# Patient Record
Sex: Female | Born: 1990 | Race: Black or African American | Hispanic: No | Marital: Single
Health system: Midwestern US, Community
[De-identification: ages and names within clinical notes are randomized; demographics above are authoritative.]

## PROBLEM LIST (undated history)

## (undated) DIAGNOSIS — F329 Major depressive disorder, single episode, unspecified: Secondary | ICD-10-CM

## (undated) DIAGNOSIS — F32A Depression, unspecified: Secondary | ICD-10-CM

## (undated) DIAGNOSIS — R519 Headache, unspecified: Secondary | ICD-10-CM

## (undated) DIAGNOSIS — F99 Mental disorder, not otherwise specified: Secondary | ICD-10-CM

## (undated) DIAGNOSIS — A64 Unspecified sexually transmitted disease: Secondary | ICD-10-CM

## (undated) DIAGNOSIS — O210 Mild hyperemesis gravidarum: Secondary | ICD-10-CM

## (undated) DIAGNOSIS — T148XXA Other injury of unspecified body region, initial encounter: Secondary | ICD-10-CM

## (undated) HISTORY — PX: THERAPEUTIC ABORTION: SHX798

## (undated) HISTORY — DX: Unspecified sexually transmitted disease: A64

---
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
## 2009-07-22 LAB — URINALYSIS W/ RFLX MICROSCOPIC
Bilirubin: NEGATIVE
Blood: NEGATIVE
Glucose: NEGATIVE MG/DL
Ketone: NEGATIVE MG/DL
Nitrites: NEGATIVE
Protein: NEGATIVE MG/DL
Specific gravity: 1.02 (ref 1.003–1.030)
Urobilinogen: 1 EU/DL (ref 0.2–1.0)
pH (UA): 7 (ref 5.0–8.0)

## 2009-07-22 LAB — HCG URINE, QL: HCG urine, QL: NEGATIVE

## 2009-07-22 LAB — WET PREP: Wet prep: NONE SEEN

## 2009-07-22 LAB — URINE MICROSCOPIC ONLY
RBC: 0 /HPF (ref 0–5)
WBC: 11 /HPF (ref 0–4)

## 2009-07-24 LAB — CHLAMYDIA/GC PCR
Chlamydia amplified: NEGATIVE
N. gonorrhea, amplified: NEGATIVE

## 2011-01-18 NOTE — ED Provider Notes (Signed)
KNOWN ALLERGIES   NKDA       TRIAGE Halford Decamp Jan 18, 2011 18:46 BMS1)   PATIENT: NAME: Julie Hayes, Julie Hayes, AGE: 21, GENDER: female, DOB:         Fri 03/27/1990, TIME OF GREET: Tue Jan 18, 2011 17:59, SSN:         [id number], KG WEIGHT: 88.5 (est.), HEIGHT: 160cm, MEDICAL RECORD         NUMBER: [id number], ACCOUNT NUMBER: [id number], PCP: Rozetta Nunnery,.         (Tue Jan 18, 2011 18:46 BMS1)   ADMISSION: URGENCY: 3, TRANSPORT: Ambulatory, DEPT: Emergency,         BED: WAITING. Halford Decamp Jan 18, 2011 18:46 BMS1)   VITAL SIGNS: BP 133/81, (Sitting), Pulse 81, Resp 20, Temp 98.4,         (Oral), Pain 5, O2 Sat 100, on Room air, Time 01/18/2011 18:43. (18:43         BMS1)   COMPLAINT:  Post Partum Bleeding 2 Weeks. Halford Decamp Jan 18, 2011 18:46         BMS1)   PRESENTING COMPLAINT:  Arrives to Er with c/o vaginal bleeding.         states vaginal delivery 12/31/10. f/u with [facility]         for bleeding concerns - seen yesterday. (19:40 LEC1)   PAIN: Patient complains of pain, Pain described as aching, On a         scale 0-10 patient rates pain as 4, Pain is intermittent, Onset was         yesterday. (19:40 LEC1)   TB SCREENING: TB screen negative for this patient. (19:40         LEC1)   ABUSE SCREENING: Patient denies physical abuse or threats. (19:40         LEC1)   FALL RISK: Fall risk assessment not applicable to this patient.         (19:40 LEC1)   SUICIDAL IDEATION: Not Applicable. (19:40 LEC1)   ADVANCE DIRECTIVES: Patient does not have advance directives.         (19:40 LEC1)   PROVIDERS: TRIAGE NURSE: Gypsy Lore, RN, MSN. (Tue Jan 18, 2011 18:46 BMS1)       PRESENTING PROBLEM (18:46 BMS1)      Presenting problems: Vaginal Bleed - Adult.       CURRENT MEDICATIONS (18:46 BMS1)   Patient not taking meds       ORDERS   ED Bedside H/H:  Ordered for: Arvella Merles, MD, Rolm Gala         Status: Done by Darrelyn Hillock, Levora Angel Jan 18, 2011 20:59.         (19:48 BJY7)   Pelvic Exam Setup:  Ordered for: Arvella Merles, MD, Rolm Gala          Status: Done by Dimas Aguas, RN, Raliegh Scarlet Tue Jan 18, 2011 19:56.         (19:50 COL1)   Female standby:  Ordered for: Arvella Merles, MD, Rolm Gala         Status: Done by Cutchins, RN, Raliegh Scarlet Tue Jan 18, 2011 20:09.         (19:50 COL1)   PELVIC/ANOSCOPY EXAM SUPPLIES:  Ordered for: Arvella Merles, MD, Erik         Status: Active. (20:10 LEC1)   h/h?:  Ordered for: Arvella Merles, MD, Rolm Gala         Status: Done  by Darrelyn Hillock, Levora Angel Jan 18, 2011 20:59.         (20:55 ZOX0)   IV- Saline Lock:  Ordered for: Arvella Merles, MD, Rolm Gala         Status: Done by Cutchins, RN, Raliegh Scarlet Tue Jan 18, 2011 21:09.         (21:09 [facility])   IV Start kit:  Ordered for: Arvella Merles, MD, Rolm Gala         Status: Active. (21:35 LEC1)      Ordered for: Arvella Merles, MD, Rolm Gala         Status: Active. (21:35 LEC1)      Ordered for: Arvella Merles, MD, Rolm Gala         Status: Active. (21:35 LEC1)   Elita Boone IV Cath:  Ordered for: Arvella Merles, MD, Rolm Gala         Status: Active. (21:35 LEC1)      Ordered for: Arvella Merles, MD, Rolm Gala         Status: Active. (21:35 LEC1)      Ordered for: Arvella Merles, MD, Rolm Gala         Status: Active. (21:35 LEC1)       NURSING ASSESSMENT: GENITOURINARY (19:40 LEC1)   CONSTITUTIONAL: History obtained from patient, Patient arrives         ambulatory, Gait steady, Patient appears comfortable, Patient         cooperative, Patient alert, Oriented to person, place and time, Skin         warm, Skin dry, Skin normal in color, Mucous membranes pink, Mucous         membranes moist, Patient is well-groomed, Patient complains of c/o         bright red bleeding s/p vaginal birth on 12/14/ 12, States being seen         at [facility] for same complaint yesterday. Patient states using 6 pads per         day for bleeding concerns.   PAIN FEMALE: cramping pain, to the lower abdomen, Onset of pain         yesterday, on a scale 0-10 patient rates pain as 4.   GENITOURINARY FEMALE: no associated urinary complaints, no         associated vaginal discharge, Associated with vaginal bleeding ,          moderate amount, of bright red blood, six pads saturated, Onset of         bleeding: yesterdayh.   ABDOMEN: Abdomen soft, tender, lower abdomen, Bowel sound normal,         Patient's last bowel was 01/18/11, no associated nausea, no associated         vomiting, no associated diarrhea, no associated constipation.   NOTES: Notes: Patient states that she is not bleeding as much as         yesterday.   SAFETY: Side rails up, Cart/Stretcher in lowest position, Family         at bedside, Call light within reach, Hospital ID band on.       NURSING PROCEDURE: DISCHARGE NOTE (21:48 LEC1)   DISCHARGE: Patient discharged to home, ambulating without         assistance, driving self, accompanied by husband/wife/partner,         Discharge instructions given to patient, Simple or moderate discharge         teaching performed, by Herbie Drape, RN, f/u as directed., Above         person(s) verbalized understanding of  discharge instructions and         follow-up care, Notes: patient in stable condition at time of         discharge to home.   BELONGINGS: Belongings remain with patient, Valuables remain with         patient.       NURSING PROCEDURE: IV   PATIENT IDENITIFIER: Patient's identity verified by patient         stating name, Patient's identity verified by patient stating birth         date, Patient's identity verified by hospital ID bracelet, Patient         actively involved in identification process. (21:06 JEC3)     Patient's identity verified by patient stating name, Patient's identity         verified by patient stating birth date. (20:55 LEC1)   IV SITE 1: IV established, to the left antecubital, in two         attempts, Unable to obtain IV access, Tourniquet removed from patient         after procedure. (21:06 JEC3)     IV therapy indicated for hydration, IV therapy indicated for medication         administration, IV established, to the right hand, using a 20 gauge          catheter, in one attempt, Saline lock established, Flushed with         normal saline (mls): 10, Labs drawn at time of placement, labeled in         the presence of the patient and sent to lab, Labs drawn at [employer]         drawn for bedside h/h, Tourniquet removed from patient after         procedure. (20:55 LEC1)   FOLLOW-UP SITE 1: After procedure, sterile transparent dressing         applied, After procedure, no drainage at IV site, After procedure, no         swelling at IV site, After procedure, no redness at IV site. (20:55         LEC1)     IV discontinued, as ordered, due to patient being discharged, catheter         intact. (21:48 LEC1)   SAFETY: Side rails up, Cart/Stretcher in lowest position, Call         light within reach, Hospital ID band on. (21:06 JEC3)       NURSING PROCEDURE: NURSE NOTES (21:34 LEC1)   NURSES NOTES: Patient in no apparent distress, Patient states         decreased pain, Patient resting quietly.       NURSING PROCEDURE: PELVIC EXAM (20:09 LEC1)   PATIENT IDENTIFIER: Patient's identity verified by patient         stating name, Patient's identity verified by patient stating birth         date.   PELVIC EXAM: Pelvic exam indicated for vaginal bleeding, Pelvic         exam performed by Dr. Jill Side, PA-C, Pelvic exam assisted by Herbie Drape, RN.   FOLLOW-UP: After procedure, bleeding continues, small amount,         Dark brown blood noted.   SAFETY: Side rails up, Cart/Stretcher in lowest position, Family         at bedside, Call light within reach, Hospital ID band on.  DIAGNOSIS (21:41 EHK1)   FINAL: PRIMARY: POST PARTUM BLEEDING.       DISPOSITION   PATIENT:  Disposition Type: Discharged, Disposition: Discharged,         Disposition Transport: Family/Friend drive, Condition: Stable. (21:41         [facility])      Patient left the department. (21:50 LEC1)       VITAL SIGNS   VITAL SIGNS: BP: 133/81 (Sitting), Pulse: 81, Resp: 20, Temp:          98.4 (Oral), Pain: 5, O2 sat: 100 on Room air, Time: 01/18/2011 18:43.         (18:43 BMS1)     BP: 125/74 (Sitting), Pulse: 77, Resp: 16, Pain: 4, O2 sat: 99 on Room         air, Time: 01/18/2011 21:34. (21:34 LEC1)       INSTRUCTION (21:42 EHK1)   SPECIAL:  Return if vomit, fever, more pain, short of breath,         HEAVY BLEEDING         Follow up with primary care physician AND ULTRASOUND @ [facility] AS         SCHEDULED ALREADY.         Return to the ER if condition worsens or new symptoms develop.         Take 600 mg (3 tabs) ibuprofen OTC 3 times daily for inflammation         Encourage fluids.       PRESCRIPTION     No recorded prescriptions       ADMIN (21:50 LEC1)   DIGITAL SIGNATURE:  Cutchins, RN, Raliegh Scarlet.   Key:     BMS1=Shortt, RN, MSN, Britta Mccreedy  COL1=OLeary, PA-C, Retta Mac, MD,     Alto Denver, ACT III, Fredrik Rigger  LEC1=Cutchins, RN, Raliegh Scarlet

## 2017-01-07 ENCOUNTER — Emergency Department

## 2017-01-07 ENCOUNTER — Inpatient Hospital Stay
Admit: 2017-01-07 | Discharge: 2017-01-07 | Disposition: A | Discharge status: Home / Self Care | Payer: PRIVATE HEALTH INSURANCE | Attending: Emergency Medicine

## 2017-01-07 DIAGNOSIS — S8011XA Contusion of right lower leg, initial encounter: Secondary | ICD-10-CM

## 2017-01-07 LAB — CBC WITH AUTOMATED DIFF
BASOPHILS: 0.5 % (ref 0–3)
EOSINOPHILS: 1 % (ref 0–5)
HCT: 41 % (ref 37.0–50.0)
HGB: 13.3 gm/dl (ref 13.0–17.2)
IMMATURE GRANULOCYTES: 0.3 % (ref 0.0–3.0)
LYMPHOCYTES: 36.4 % (ref 28–48)
MCH: 27.5 pg (ref 25.4–34.6)
MCHC: 32.4 gm/dl (ref 30.0–36.0)
MCV: 84.9 fL (ref 80.0–98.0)
MONOCYTES: 5.9 % (ref 1–13)
MPV: 10.2 fL — ABNORMAL HIGH (ref 6.0–10.0)
NEUTROPHILS: 55.9 % (ref 34–64)
NRBC: 0 (ref 0–0)
PLATELET: 201 10*3/uL (ref 140–450)
RBC: 4.83 M/uL (ref 3.60–5.20)
RDW-SD: 42.7 (ref 36.4–46.3)
WBC: 6.3 10*3/uL (ref 4.0–11.0)

## 2017-01-07 LAB — D DIMER: D DIMER: 0.57 ug/mL (FEU) — ABNORMAL HIGH (ref 0.01–0.50)

## 2017-01-07 LAB — D-DIMER, QUANTITATIVE: D-Dimer, Quant: 0.57 ug/mL (FEU) — ABNORMAL HIGH (ref 0.01–0.50)

## 2017-01-07 NOTE — ED Notes (Signed)
PVL tech paged, awaiting return phone call to confirm they are aware of the order.

## 2017-01-07 NOTE — ED Notes (Signed)
PVL tech at bedside

## 2017-01-07 NOTE — ED Triage Notes (Signed)
Pt states lower extremity bruising states symptoms have been ongoing for the

## 2017-01-07 NOTE — ED Notes (Signed)
PVL is finished

## 2017-01-07 NOTE — ED Provider Notes (Addendum)
[facility]  [facility] Treatment [facility]    Patient: Julie Hayes Age: 26 y.o. Sex: female    Date of Birth: Oct 14, 1990 Admit Date: 01/07/2017 PCP: None   MRN: [id number]  CSN: [id number]  Attending: Christiana PellantFICKENSCHER, BEN A, MD   Room: 102/EO02 Time Dictated: 1:08 AM APP: Clayborne Danalushola Juliette Standre       Chief Complaint   Chief Complaint   Patient presents with   ??? Bleeding/Bruising     just brusises on leg       History of Present Illness   26 y.o. female otherwise healthy who presents to the ED today complaining about bruising that started about a month ago.  Patient states she noted of big bruise in her right thigh a month ago then a week later noted another bruise on her left leg.  She also reports some pain in her bilateral legs that radiates to her by hip area.  States this typically happens when her menstrual cycle is about to come on so she is unsure if this is related.  She denies any trauma or falls.  She had a recent long distance travels of 6 hours and she wants to be evaluated to see if she has any blood clots.  Of note, patient takes Motrin 600 mg roughly 4 times a week for history of migraines.  She also reports heavy flow for 5 days with menstrual.  However states it improved from previous 7 days.  Denies any hormonal use, no recent surgeries or history of blood clots.    Review of Systems   Review of Systems   Constitutional: Negative for chills and fever.   Respiratory: Negative for shortness of breath.    Cardiovascular: Negative for chest pain.   Musculoskeletal:        Shooting pain from bilateral legs to hip area   Neurological: Negative for dizziness and loss of consciousness.   Endo/Heme/Allergies: Bruises/bleeds easily.       Past Medical/Surgical History   Past Medical history: None  Past surgical history: None    Social History     Social History     Socioeconomic History   ??? Marital status: SINGLE     Spouse name: Not on file   ??? Number of children: Not on file    ??? Years of education: Not on file   ??? Highest education level: Not on file       Family History   History reviewed. No pertinent family history.    Current Medications     None       Allergies   No Known Allergies    Physical Exam     ED Triage Vitals   Enc Vitals Group      BP 01/07/17 0039 136/66      Pulse (Heart Rate) 01/07/17 0039 88      Resp Rate 01/07/17 0039 18      Temp 01/07/17 0039 98.3 ??F (36.8 ??C)      Temp src --       O2 Sat (%) 01/07/17 0039 100 %      Weight 01/07/17 0040 227 lb      Height 01/07/17 0040 5\' 4"       Head Circumference --       Peak Flow --       Pain Score --       Pain Loc --       Pain Edu? --  Excl. in GC? --      Physical Exam   Constitutional: She is well-developed, well-nourished, and in no distress. No distress.   HENT:   Head: Normocephalic and atraumatic.   Eyes: Conjunctivae are normal.   Pulmonary/Chest: Effort normal and breath sounds normal. No respiratory distress. She has no wheezes.   Musculoskeletal: Normal range of motion. She exhibits no edema, tenderness or deformity.   Negative bilateral calf tenderness.  No edema bilaterally   Skin: Skin is warm and dry. No rash noted. She is not diaphoretic. No erythema.   Nursing note and vitals reviewed.      Impression and Management Plan   Patient is a 26 year old female who presents the ED with several episodes of easy bruising in her bilateral lower extremity.  Also reports some lower extremity pain radiating to her right hip area as well as a recent long distance travel 4 days ago started the same day.  We will get a CBC, d-dimer to evaluate for thrombocytopenia and blood clots.    Diagnostic Studies     Results for orders placed or performed during the hospital encounter of 01/07/17   CBC WITH AUTOMATED DIFF   Result Value Ref Range    WBC 6.3 4.0 - 11.0 1000/mm3    RBC 4.83 3.60 - 5.20 M/uL    HGB 13.3 13.0 - 17.2 gm/dl    HCT 16.141.0 09.637.0 - 04.550.0 %    MCV 84.9 80.0 - 98.0 fL    MCH 27.5 25.4 - 34.6 pg     MCHC 32.4 30.0 - 36.0 gm/dl    PLATELET 409201 811140 - 914450 1000/mm3    MPV 10.2 (H) 6.0 - 10.0 fL    RDW-SD 42.7 36.4 - 46.3      NRBC 0 0 - 0      IMMATURE GRANULOCYTES 0.3 0.0 - 3.0 %    NEUTROPHILS 55.9 34 - 64 %    LYMPHOCYTES 36.4 28 - 48 %    MONOCYTES 5.9 1 - 13 %    EOSINOPHILS 1.0 0 - 5 %    BASOPHILS 0.5 0 - 3 %   D DIMER   Result Value Ref Range    D DIMER 0.57 (H) 0.01 - 0.50 ug/mL (FEU)         ED Course          ER Medications Given:  Medications - No data to display    Medical Decision Making   CBC is unremarkable.  Platelets and hemoglobin are within normal limits.  D-dimer is elevated at 0.57.  Will get a duplex bilateral lower extremity PVL in the ED overflow in the morning.  If negative patient can be discharged home.        Final Diagnosis        ICD-10-CM ICD-9-CM   1. Bruising T14.8XXA 924.9   2. Bilateral leg pain M79.604 729.5    M79.605    3. Elevated d-dimer R79.89 790.92       Disposition   ED overflow for PVL in the morning.    The patient was personally evaluated by myself and Carmela HurtFICKENSCHER, BEN A, MD who agrees with the above assessment and plan.        Dragon medical dictation software was used for portions of this report. Unintended errors may occur.       Lynelle Doctorlushola Margorie Renner, PA-C  January 07, 2017      My signature above authenticates this document and my orders, the  final ??  diagnosis (es), discharge prescription (s), and instructions in the Epic ??  record.  If you have any questions please contact [phone].  ??  Nursing notes have been reviewed by the physician/ advanced practice Clinician.

## 2017-01-07 NOTE — ED Notes (Signed)
I was asked to follow-up on the patient's PVL results.  They were negative for DVT.  Discussed with patient and given primary care resources for follow-up.    PVL Bilateral Lower Extremity Venous Preliminary Results  ??  Bilateral:  No evidence of thrombus identified in the deep veins and the proximal greater saphenous vein.  ??  Final MD report to follow.  Exam Performed by: Tod Persiaara Dzendzel, RVT,RVS        Julien GirtErin E Shalisa Mcquade, PA-C  10:01 AM  01/07/17

## 2017-01-07 NOTE — ED Notes (Signed)
10:10 AM  01/07/17     Discharge instructions given to patient (name) with verbalization of understanding. Patient accompanied by friend.  Patient discharged with the following prescriptions none. Patient discharged to home (destination).      Darleen Moffitt Jackelyn KnifeUyen T. Britt Petroni, RN

## 2017-01-07 NOTE — Progress Notes (Signed)
PVL Bilateral Lower Extremity Venous Preliminary Results    Bilateral:  No evidence of thrombus identified in the deep veins and the proximal greater saphenous vein.    Final MD report to follow.  Exam Performed by: Cara Dzendzel, RVT,RVS

## 2017-01-07 NOTE — ED Notes (Signed)
Report given to Toniann FailWendy, RN and Mimi, Charity fundraiserN (name) on [name].     Report consisted of patient???s Situation, Background, Assessment and   Recommendations(SBAR).     Opportunity for questions and clarification was provided.

## 2017-01-07 NOTE — ED Notes (Signed)
Patient denies any pain at this time. Was advised to take off pants to be ready for PVL.

## 2017-01-07 NOTE — ED Notes (Signed)
TRANSFER - IN REPORT:    Verbal report received from Ore Hillharlene, RN on [name].      Report consisted of patient???s Situation, Background, Assessment and   Recommendations(SBAR).     Information from the following report(s) SBAR, Kardex, ED Summary, [facility] and Recent Results was reviewed with the receiving nurse.    Opportunity for questions and clarification was provided.      Assessment completed upon patient???s care assumed.

## 2018-01-24 ENCOUNTER — Ambulatory Visit (INDEPENDENT_AMBULATORY_CARE_PROVIDER_SITE_OTHER): Payer: 59 | Admitting: Gynecology

## 2018-01-24 ENCOUNTER — Encounter: Payer: Self-pay | Admitting: Gynecology

## 2018-01-24 VITALS — BP 118/78 | Ht 64.0 in | Wt 223.0 lb

## 2018-01-24 DIAGNOSIS — Z01419 Encounter for gynecological examination (general) (routine) without abnormal findings: Secondary | ICD-10-CM | POA: Diagnosis not present

## 2018-01-24 DIAGNOSIS — Z113 Encounter for screening for infections with a predominantly sexual mode of transmission: Secondary | ICD-10-CM | POA: Diagnosis not present

## 2018-01-24 DIAGNOSIS — N951 Menopausal and female climacteric states: Secondary | ICD-10-CM | POA: Diagnosis not present

## 2018-01-24 DIAGNOSIS — L680 Hirsutism: Secondary | ICD-10-CM

## 2018-01-24 DIAGNOSIS — N898 Other specified noninflammatory disorders of vagina: Secondary | ICD-10-CM

## 2018-01-24 LAB — WET PREP FOR TRICH, YEAST, CLUE

## 2018-01-24 MED ORDER — METRONIDAZOLE 500 MG PO TABS: 500.0000 mg | ORAL_TABLET | Freq: Two times a day (BID) | ORAL | 0 refills | Status: DC

## 2018-01-24 NOTE — Progress Notes (Signed)
Manna Howze 08-15-1990 [id number]        28 y.o.  [id number] new patient for annual gynecologic exam.  Notes vaginal itching several days before her menses each month.  No significant discharge or odor.  Also complaining of hot flashes almost on a daily basis starting 7 years ago after the birth of her child.  Having regular monthly menses.  Recently screened by her primary with normal lab work to include thyroid.  Lastly the patient notes hair growth on her chin which started after the birth of her child 7 years ago but seems to be progressively getting worse.  Also has hair growth around her nipples.  Past medical history,surgical history, problem list, medications, allergies, family history and social history were all reviewed and documented as reviewed in the EPIC chart.  ROS:  Performed with pertinent positives and negatives included in the history, assessment and plan.   Additional significant findings : None   Exam: Kennon Portela assistant Vitals:   01/24/18 0824  BP: 118/78  Weight: 223 lb (101.2 kg)  Height: 5\' 4"  (1.626 m)   Body mass index is 38.28 kg/m.  General appearance:  Normal affect, orientation and appearance. Skin: Grossly normal excepting terminal hair growth on her chin HEENT: Without gross lesions.  No cervical or supraclavicular adenopathy. Thyroid normal.  Lungs:  Clear without wheezing, rales or rhonchi Cardiac: RR, without RMG Abdominal:  Soft, nontender, without masses, guarding, rebound, organomegaly or hernia Breasts:  Examined lying and sitting without masses, retractions, discharge or axillary adenopathy. Pelvic:  Ext, BUS, Vagina: Normal with whitish discharge  Cervix: Normal.  Pap smear done  Uterus: Anteverted, normal size, shape and contour, midline and mobile nontender   Adnexa: Without masses or tenderness    Anus and perineum: Normal    Assessment/Plan:  28 y.o. [id number] female for annual gynecologic exam with regular menses.   1. Hot  flushes.  Recent screen for thyroid negative.  Discussed differential to include hormonal fluctuations although not true menopause or perimenopause versus other medical reasons.  Recently saw her primary with that complaint by her history with negative blood work.  Will check baseline FSH and estradiol level.  Discussed possible trial of estrogen to see if this does not help resolve her symptoms.  Recommended she consider low-dose oral contraceptives which will help address #2.  Risks of oral contraceptives to include increased risk of thrombosis discussed.  Will rediscuss after lab work. 2. 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  Acne, lower abdominal hair growth, masculinizing signs such as balding or clitorimegaly.  Suspect physiologic/ovarian androgen as source.  Will check baseline testosterone with SH[facility].  Do not feel 17 hydroxyprogesterone or DHEAS necessary at this point.  Discussed low-dose oral contraceptives as a treatment option which they addressed #1.  Will rediscuss after lab work. 3. Contraception.  Not an issue as patient is in a same-sex relationship. 4. STD screening requested.  No known exposure but wants to be screened.  GC/Chlamydia added to her Pap smear.  HIV, RPR, hepatitis B and hepatitis C drawn. 5. Vaginal itching preceding her menses.  Wet prep and exam consistent with bacterial vaginosis.  Will treat with Flagyl 500 mg twice daily x7 days.  Alcohol avoidance discussed.  Follow-up if symptoms persist. 6. Pap smear done today.  No history of abnormal Pap smears previously.  Continue with every 3-year screening for now. 7. Breast health.  Breast exam normal today.  No strong family history of breast cancer.  8. Health maintenance.  Patient is actively followed by a primary provider.  No blood work done today.  We will follow-up with them for ongoing general health care.   Dara Lords MD, 8:42 AM 01/24/2018

## 2018-01-24 NOTE — Addendum Note (Signed)
Addended by: Dayna Barker on: 01/24/2018 02:47 PM   Modules accepted: Orders

## 2018-01-24 NOTE — Patient Instructions (Signed)
Office will call you with the lab results.  Start on the metronidazole 500 mg twice daily for 7 days to treat the vaginal infection.  Avoid alcohol while taking.

## 2018-01-24 NOTE — Addendum Note (Signed)
Addended by: Dayna BarkerGARDNER, Shevawn Langenberg K on: 01/24/2018 09:44 AM   Modules accepted: Orders

## 2018-01-26 LAB — PAP THINPREP ASCUS RFLX HPV RFLX TYPE
C. trachomatis RNA, TMA: NOT DETECTED
N. GONORRHOEAE RNA, TMA: NOT DETECTED

## 2018-01-29 ENCOUNTER — Other Ambulatory Visit: Payer: Self-pay | Admitting: Gynecology

## 2018-01-29 LAB — HEPATITIS C ANTIBODY
Hepatitis C Ab: NONREACTIVE
SIGNAL TO CUT-OFF: 0.02 (ref <1.00)

## 2018-01-29 LAB — HEPATITIS B SURFACE ANTIGEN: Hepatitis B Surface Ag: NONREACTIVE

## 2018-01-29 LAB — TESTOS,TOTAL,FREE AND SHBG (FEMALE)
Free Testosterone: 3.2 pg/mL (ref 0.1–6.4)
Sex Hormone Binding: 57 nmol/L (ref 17–124)
Testosterone, Total, LC-MS-MS: 34 ng/dL (ref 2–45)

## 2018-01-29 LAB — FOLLICLE STIMULATING HORMONE: FSH: 6.1 m[IU]/mL

## 2018-01-29 LAB — ESTRADIOL: Estradiol: 40 pg/mL

## 2018-01-29 LAB — RPR: RPR Ser Ql: NONREACTIVE

## 2018-01-29 LAB — HIV ANTIBODY (ROUTINE TESTING W REFLEX): HIV 1&2 Ab, 4th Generation: NONREACTIVE

## 2018-01-29 MED ORDER — NORETHIN ACE-ETH ESTRAD-FE 1-20 MG-MCG PO TABS: 1.0000 | ORAL_TABLET | Freq: Every day | ORAL | 11 refills | Status: DC

## 2018-03-21 ENCOUNTER — Other Ambulatory Visit: Payer: Self-pay

## 2018-03-21 ENCOUNTER — Emergency Department (HOSPITAL_COMMUNITY): Admission: EM | Admit: 2018-03-21 | Discharge: 2018-03-21 | Discharge status: Against Medical Advice | Payer: 59

## 2018-03-21 NOTE — ED Triage Notes (Signed)
Called for pt x 1 from waiting room with no response.

## 2018-03-21 NOTE — ED Notes (Signed)
Pt. Called to triage x 2 but no answer.

## 2018-07-18 ENCOUNTER — Ambulatory Visit (INDEPENDENT_AMBULATORY_CARE_PROVIDER_SITE_OTHER): Payer: 59

## 2018-07-18 ENCOUNTER — Other Ambulatory Visit: Payer: Self-pay

## 2018-07-18 DIAGNOSIS — Z3200 Encounter for pregnancy test, result unknown: Secondary | ICD-10-CM

## 2018-07-18 DIAGNOSIS — Z3201 Encounter for pregnancy test, result positive: Secondary | ICD-10-CM | POA: Diagnosis not present

## 2018-07-18 LAB — POCT URINE PREGNANCY: Preg Test, Ur: POSITIVE — AB

## 2018-07-18 NOTE — Progress Notes (Signed)
..   Ms. Ganson presents today for UPT. She has no unusual complaints. LMP: 05-31-18    OBJECTIVE: Appears well, in no apparent distress.  OB History    Gravida  4   Para  1   Term  1   Preterm  0   AB  2   Living  1     SAB  0   TAB  2   Ectopic  0   Multiple  0   Live Births  1          Home UPT Result:Positive  In-Office UPT result:Positive I have reviewed the patient's medical, obstetrical, social, and family histories, and medications.   ASSESSMENT: Positive pregnancy test  PLAN Prenatal care to be completed at: [facility]

## 2018-08-02 ENCOUNTER — Other Ambulatory Visit: Payer: Self-pay

## 2018-08-02 ENCOUNTER — Emergency Department (HOSPITAL_COMMUNITY): Payer: 59

## 2018-08-02 ENCOUNTER — Emergency Department (HOSPITAL_COMMUNITY)
Admission: EM | Admit: 2018-08-02 | Discharge: 2018-08-02 | Disposition: A | Discharge status: Home / Self Care | Payer: 59 | Attending: Emergency Medicine | Admitting: Emergency Medicine

## 2018-08-02 ENCOUNTER — Encounter (HOSPITAL_COMMUNITY): Payer: Self-pay

## 2018-08-02 DIAGNOSIS — O418X10 Other specified disorders of amniotic fluid and membranes, first trimester, not applicable or unspecified: Secondary | ICD-10-CM | POA: Insufficient documentation

## 2018-08-02 DIAGNOSIS — Z3A01 Less than 8 weeks gestation of pregnancy: Secondary | ICD-10-CM | POA: Diagnosis not present

## 2018-08-02 DIAGNOSIS — O468X1 Other antepartum hemorrhage, first trimester: Secondary | ICD-10-CM | POA: Insufficient documentation

## 2018-08-02 DIAGNOSIS — O200 Threatened abortion: Secondary | ICD-10-CM | POA: Diagnosis not present

## 2018-08-02 DIAGNOSIS — Z87891 Personal history of nicotine dependence: Secondary | ICD-10-CM | POA: Insufficient documentation

## 2018-08-02 LAB — BASIC METABOLIC PANEL
Anion gap: 7 (ref 5–15)
BUN: 10 mg/dL (ref 6–20)
CO2: 25 mmol/L (ref 22–32)
Calcium: 9.5 mg/dL (ref 8.9–10.3)
Chloride: 105 mmol/L (ref 98–111)
Creatinine, Ser: 0.61 mg/dL (ref 0.44–1.00)
GFR calc Af Amer: >60 mL/min (ref >60)
GFR calc non Af Amer: >60 mL/min (ref >60)
Glucose, Bld: 92 mg/dL (ref 70–99)
Potassium: 4.3 mmol/L (ref 3.5–5.1)
Sodium: 137 mmol/L (ref 135–145)

## 2018-08-02 LAB — URINALYSIS, ROUTINE W REFLEX MICROSCOPIC
Bilirubin Urine: NEGATIVE
Glucose, UA: NEGATIVE mg/dL
Hgb urine dipstick: NEGATIVE
Ketones, ur: NEGATIVE mg/dL
Leukocytes,Ua: NEGATIVE
Nitrite: NEGATIVE
Protein, ur: NEGATIVE mg/dL
Specific Gravity, Urine: 1.021 (ref 1.005–1.030)
pH: 6 (ref 5.0–8.0)

## 2018-08-02 LAB — CBC
HCT: 40.9 % (ref 36.0–46.0)
Hemoglobin: 13.1 g/dL (ref 12.0–15.0)
MCH: 28.1 pg (ref 26.0–34.0)
MCHC: 32 g/dL (ref 30.0–36.0)
MCV: 87.8 fL (ref 80.0–100.0)
Platelets: 241 10*3/uL (ref 150–400)
RBC: 4.66 MIL/uL (ref 3.87–5.11)
RDW: 13.8 % (ref 11.5–15.5)
WBC: 7.7 10*3/uL (ref 4.0–10.5)
nRBC: 0 % (ref 0.0–0.2)

## 2018-08-02 LAB — WET PREP, GENITAL
Sperm: NONE SEEN
Trich, Wet Prep: NONE SEEN
Yeast Wet Prep HPF POC: NONE SEEN

## 2018-08-02 LAB — TROPONIN I (HIGH SENSITIVITY)
Troponin I (High Sensitivity): <2 ng/L (ref <18)
Troponin I (High Sensitivity): <2 ng/L (ref <18)

## 2018-08-02 LAB — HCG, QUANTITATIVE, PREGNANCY: hCG, Beta Chain, Quant, S: 18195 m[IU]/mL — ABNORMAL HIGH (ref <5)

## 2018-08-02 LAB — ABO/RH: ABO/RH(D): B POS

## 2018-08-02 MED ORDER — SODIUM CHLORIDE 0.9% FLUSH
3.0000 mL | Freq: Once | INTRAVENOUS | Status: AC
  Administered 2018-08-02: 3 mL via INTRAVENOUS

## 2018-08-02 NOTE — Discharge Instructions (Signed)
Do not lift anything that is heavier than 10 lbs. (4.5 kg) or as told by your health care provider. Do not use any products that contain nicotine or tobacco, such as cigarettes and e-cigarettes. If you need help quitting, ask your health care provider. Track and write down the number of pads you use each day and how soaked (saturated) they are. Do not use tampons. Keep all follow-up visits as told by your health care provider. This is important. Your health care provider may ask you to have follow-up blood tests or ultrasound tests or both.  Contact a health care provider if: You have any vaginal bleeding. You have a fever. Get help right away if: You have severe cramps in your stomach, back, abdomen, or pelvis. You pass large clots or tissue. Save any tissue for your health care provider to look at. You have more vaginal bleeding, and you faint or become lightheaded or weak.

## 2018-08-02 NOTE — ED Triage Notes (Signed)
Patient c/o abdominal cramping x 1-2 weeks. Patient states she has been having spotting x 1 week. Patient states when she wipes she has pink on the tissue paper and noticed it the first time after having intercourse.  Patient c/o intermittent mid chest pain  X 4 months.

## 2018-08-02 NOTE — ED Provider Notes (Signed)
[facility]-EMERGENCY DEPT Provider Note   CSN: [id number] Arrival date & time: 08/02/18  1740     History   Chief Complaint Chief Complaint  Patient presents with  . Abdominal Cramping  . Vaginal Bleeding  . [redacted] weeks pregnant  . Chest Pain    HPI Megan Chambers is a 28 y.o. female.  Who presents emergency department with chief complaint of vaginal bleeding.  She is G5, P1.  She believes she has a currently about 9 or [redacted] weeks pregnant her last menstrual period was May 31, 2018.  Patient states that she developed some vaginal bleeding after intercourse last week.  It has been persistent.  She has had some cramping in her abdomen.  She denies any fluid or passage of tissue from her vagina.  She denies fevers, chills, back pain, urinary symptoms.     HPI  Past Medical History:  Diagnosis Date  . STD (sexually transmitted disease)    Chlamydia,Trich    There are no active problems to display for this patient.   Past Surgical History:  Procedure Laterality Date  . THERAPEUTIC ABORTION       OB History    Gravida  4   Para  1   Term  1   Preterm  0   AB  2   Living  1     SAB  0   TAB  2   Ectopic  0   Multiple  0   Live Births  1            Home Medications    Prior to Admission medications   Medication Sig Start Date End Date Taking? Authorizing Provider  Prenatal Vit-Fe Fumarate-FA (PRENATAL MULTIVITAMIN) TABS tablet Take 1 tablet by mouth daily at 12 noon.   Yes [provider]  metroNIDAZOLE (FLAGYL) 500 MG tablet Take 1 tablet (500 mg total) by mouth 2 (two) times daily. Patient not taking: Reported on 07/18/2018 01/24/18   Fontaine, Nadyne Coombesimothy P, MD  norethindrone-ethinyl estradiol (LOESTRIN FE 1/20) 1-20 MG-MCG tablet Take 1 tablet by mouth daily. Patient not taking: Reported on 07/18/2018 01/29/18   Fontaine, Nadyne Coombesimothy P, MD    Family History Family History  Problem Relation Age of Onset  . Diabetes Mother   .  Diabetes Maternal Grandmother     Social History Social History   Tobacco Use  . Smoking status: Former Games developermoker  . Smokeless tobacco: Never Used  Substance Use Topics  . Alcohol use: Not Currently    Comment: Occas.  . Drug use: Not Currently    Types: Marijuana     Allergies   Patient has no known allergies.   Review of Systems Review of Systems Ten systems reviewed and are negative for acute change, except as noted in the HPI.    Physical Exam Updated Vital Signs BP 128/79 (BP Location: Left Arm)   Pulse 86   Temp 98.5 F (36.9 C) (Oral)   Resp 14   Ht 5' 4.5" (1.638 m)   Wt 105.2 kg   LMP 12/24/2017   SpO2 99%   BMI 39.21 kg/m   Physical Exam Vitals signs and nursing note reviewed. Exam conducted with a chaperone present.  Constitutional:      General: She is not in acute distress.    Appearance: She is well-developed. She is not diaphoretic.  HENT:     Head: Normocephalic and atraumatic.  Eyes:     General: No scleral icterus.  Conjunctiva/sclera: Conjunctivae normal.  Neck:     Musculoskeletal: Normal range of motion.  Cardiovascular:     Rate and Rhythm: Normal rate and regular rhythm.     Heart sounds: Normal heart sounds. No murmur. No friction rub. No gallop.   Pulmonary:     Effort: Pulmonary effort is normal. No respiratory distress.     Breath sounds: Normal breath sounds.  Abdominal:     General: Bowel sounds are normal. There is no distension.     Palpations: Abdomen is soft. There is no mass.     Tenderness: There is no abdominal tenderness. There is no guarding.  Genitourinary:    General: Normal vulva.     Vagina: Vaginal discharge present.     Cervix: No cervical motion tenderness, discharge, friability or erythema.     Uterus: Enlarged.      Adnexa: Right adnexa normal and left adnexa normal.  Skin:    General: Skin is warm and dry.  Neurological:     Mental Status: She is alert and oriented to person, place, and time.   Psychiatric:        Behavior: Behavior normal.      ED Treatments / Results  Labs (all labs ordered are listed, but only abnormal results are displayed) Labs Reviewed  WET PREP, GENITAL  CBC  URINALYSIS, ROUTINE W REFLEX MICROSCOPIC  BASIC METABOLIC PANEL  HCG, QUANTITATIVE, PREGNANCY  RPR  HIV ANTIBODY (ROUTINE TESTING W REFLEX)  ABO/RH  GC/CHLAMYDIA PROBE AMP ([place]) NOT AT [facility]  TROPONIN I (HIGH SENSITIVITY)    EKG None  Radiology Dg Chest 2 View  Result Date: 08/02/2018 CLINICAL DATA:  2 month history of mid chest pain. EXAM: CHEST - 2 VIEW COMPARISON:  None. FINDINGS: The heart size and mediastinal contours are within normal limits. Both lungs are clear. The visualized skeletal structures are unremarkable. IMPRESSION: No active cardiopulmonary disease. Electronically Signed   By: Misty Stanley M.D.   On: 08/02/2018 18:59    Procedures Procedures (including critical care time)  Medications Ordered in ED Medications  sodium chloride flush (NS) 0.9 % injection 3 mL (3 mLs Intravenous Given 08/02/18 2053)     Initial Impression / Assessment and Plan / ED Course  I have reviewed the triage vital signs and the nursing notes.  Pertinent labs & imaging results that were available during my care of the patient were reviewed by me and considered in my medical decision making (see chart for details).        28 year old female here with vaginal bleeding.  Urine appears negative.  CBC without abnormality.  Negative troponin, wet prep shows some clue cells however patient denies foul odor and does have some spotting .  Patient ultrasound shows an estimated 5-week 4-day pregnancy without intrauterine gestational sac yolk sac or fetal pole.  Her hCG is consistent with her estimated week of pregnancy however she will need 2-week follow-up on her hCG.  She also has a moderate core subchorionic hemorrhage.  I had a long discussion with the patient about the findings, her  need for close follow-up she actually has a scheduled appointment with her OB/GYN in 1 week.  I doubt any emergent cause of her symptoms at this time.  I also personally reviewed the patient's 2 view chest x-ray which shows no acute abnormalities.  I think that her chest pain complaints are likely related to anxiety.  Patient appears appropriate for discharge at this time.  She has close outpatient  follow-up. Final Clinical Impressions(s) / ED Diagnoses   Final diagnoses:  None    ED Discharge Orders    None       Arthor CaptainHarris, Tameya Kuznia, PA-C 08/03/18 1804    Molpus, Jonny RuizJohn, MD 08/03/18 2239

## 2018-08-02 NOTE — ED Notes (Signed)
Pelvic setup at bedside.

## 2018-08-03 LAB — RPR: RPR Ser Ql: NONREACTIVE

## 2018-08-03 LAB — HIV ANTIBODY (ROUTINE TESTING W REFLEX): HIV Screen 4th Generation wRfx: NONREACTIVE

## 2018-08-07 LAB — GC/CHLAMYDIA PROBE AMP (~~LOC~~) NOT AT ARMC
Chlamydia: NEGATIVE
Neisseria Gonorrhea: NEGATIVE

## 2018-08-09 NOTE — Progress Notes (Signed)
Patient ID: Megan Chambers, female   DOB: 1990-10-30, 28 y.o.   MRN: [id number] Patient seen and assessed by nursing staff during this encounter. I have reviewed the chart and agree with the documentation and plan.  Emeterio Reeve, MD 08/09/2018 11:04 AM

## 2018-08-15 ENCOUNTER — Inpatient Hospital Stay (HOSPITAL_COMMUNITY)
Admission: AD | Admit: 2018-08-15 | Discharge: 2018-08-15 | Disposition: A | Discharge status: Home / Self Care | Payer: 59 | Source: Ambulatory Visit | Attending: Obstetrics and Gynecology | Admitting: Obstetrics and Gynecology

## 2018-08-15 ENCOUNTER — Ambulatory Visit (INDEPENDENT_AMBULATORY_CARE_PROVIDER_SITE_OTHER): Payer: 59 | Admitting: Certified Nurse Midwife

## 2018-08-15 ENCOUNTER — Other Ambulatory Visit: Payer: Self-pay

## 2018-08-15 ENCOUNTER — Encounter: Payer: Self-pay | Admitting: Certified Nurse Midwife

## 2018-08-15 ENCOUNTER — Inpatient Hospital Stay (HOSPITAL_COMMUNITY): Payer: 59

## 2018-08-15 VITALS — BP 115/74 | HR 88 | Wt 237.0 lb

## 2018-08-15 DIAGNOSIS — Z87891 Personal history of nicotine dependence: Secondary | ICD-10-CM | POA: Diagnosis not present

## 2018-08-15 DIAGNOSIS — O021 Missed abortion: Secondary | ICD-10-CM | POA: Diagnosis not present

## 2018-08-15 DIAGNOSIS — O200 Threatened abortion: Secondary | ICD-10-CM

## 2018-08-15 DIAGNOSIS — Z3A10 10 weeks gestation of pregnancy: Secondary | ICD-10-CM | POA: Insufficient documentation

## 2018-08-15 DIAGNOSIS — R109 Unspecified abdominal pain: Secondary | ICD-10-CM

## 2018-08-15 DIAGNOSIS — O3680X0 Pregnancy with inconclusive fetal viability, not applicable or unspecified: Secondary | ICD-10-CM

## 2018-08-15 DIAGNOSIS — O209 Hemorrhage in early pregnancy, unspecified: Secondary | ICD-10-CM

## 2018-08-15 DIAGNOSIS — O26891 Other specified pregnancy related conditions, first trimester: Secondary | ICD-10-CM | POA: Diagnosis not present

## 2018-08-15 DIAGNOSIS — O26899 Other specified pregnancy related conditions, unspecified trimester: Secondary | ICD-10-CM

## 2018-08-15 LAB — CBC
HCT: 38.9 % (ref 36.0–46.0)
Hemoglobin: 12.6 g/dL (ref 12.0–15.0)
MCH: 28 pg (ref 26.0–34.0)
MCHC: 32.4 g/dL (ref 30.0–36.0)
MCV: 86.4 fL (ref 80.0–100.0)
Platelets: 230 10*3/uL (ref 150–400)
RBC: 4.5 MIL/uL (ref 3.87–5.11)
RDW: 13.5 % (ref 11.5–15.5)
WBC: 7.6 10*3/uL (ref 4.0–10.5)
nRBC: 0 % (ref 0.0–0.2)

## 2018-08-15 LAB — HCG, QUANTITATIVE, PREGNANCY: hCG, Beta Chain, Quant, S: 15157 m[IU]/mL — ABNORMAL HIGH (ref <5)

## 2018-08-15 NOTE — MAU Note (Signed)
Pt sent from office for U/S and repeat HCG. Has been having vaginal bleeding for about 3 weeks.   Now it is spotting with brown blood. Cramping pain is 4/10.  Denies LOF, fever.

## 2018-08-15 NOTE — Progress Notes (Signed)
History:  [name] is a 28 y.o. [phone] who presents to clinic today for NOB appointment, patient seen in [place] on 7/16 and noted to have gestational sac but no yolk sac or embryo. No f/u US scheduled by ED was told to follow up at NOB today.   Patient reports continue spotting since being seen, has not passed any clots, describes the bleeding as light pink spotting with some abdominal cramping.   The following portions of the patient's history were reviewed and updated as appropriate: allergies, current medications, family history, past medical history, social history, past surgical history and problem list.  Review of Systems:  Review of Systems  Constitutional: Negative.   Respiratory: Negative.   Cardiovascular: Negative.   Gastrointestinal: Positive for abdominal pain. Negative for nausea and vomiting.  Genitourinary:       Vaginal bleeding present  Neurological: Negative.       Objective:  Physical Exam BP 115/74   Pulse 88   Wt 237 lb (107.5 kg)   LMP 12/24/2017   BMI 40.05 kg/m  Physical Exam HENT:     Head: Normocephalic.  Abdominal:     General: There is no distension.     Palpations: Abdomen is soft.     Tenderness: There is no abdominal tenderness.  Genitourinary:    Comments: Pelvic examination deferred Skin:    General: Skin is warm and dry.  Neurological:     Mental Status: She is alert and oriented to person, place, and time.  Psychiatric:        Mood and Affect: Mood normal.        Behavior: Behavior normal.        Thought Content: Thought content normal.    US Ob Comp < 14 Wks  Result Date: 08/02/2018 CLINICAL DATA:  Vaginal bleeding EXAM: OBSTETRIC <14 WK [place] AND TRANSVAGINAL OB US TECHNIQUE: Both transabdominal and transvaginal ultrasound examinations were performed for complete evaluation of the gestation as well as the maternal uterus, adnexal regions, and pelvic cul-de-sac. Transvaginal technique was performed to assess early pregnancy.  COMPARISON:  None. FINDINGS: Intrauterine gestational sac: Single Yolk sac:  Not visualized Embryo:  Not visualized Cardiac Activity: Not visualized Heart Rate:   bpm MSD: 8.0 mm   5 w   4 d CRL:    mm    w    d                  [place] EDC: Subchorionic hemorrhage:  Moderate subchorionic hemorrhage Maternal uterus/adnexae: No adnexal mass or free fluid. IMPRESSION: Probable early intrauterine gestational sac, but no yolk sac, fetal pole, or cardiac activity yet visualized. Recommend follow-up quantitative B-HCG levels and follow-up US in 14 days to assess viability. This recommendation follows SRU consensus guidelines: Diagnostic Criteria for Nonviable Pregnancy Early in the First Trimester. [place] Med 2013; [id number]:9371-69. Moderate subchorionic hemorrhage. Electronically Signed   By: Rolm Baptise M.D.   On: 08/02/2018 22:20   US Ob Transvaginal  Result Date: 08/02/2018 CLINICAL DATA:  Vaginal bleeding EXAM: OBSTETRIC <14 WK [place] AND TRANSVAGINAL OB US TECHNIQUE: Both transabdominal and transvaginal ultrasound examinations were performed for complete evaluation of the gestation as well as the maternal uterus, adnexal regions, and pelvic cul-de-sac. Transvaginal technique was performed to assess early pregnancy. COMPARISON:  None. FINDINGS: Intrauterine gestational sac: Single Yolk sac:  Not visualized Embryo:  Not visualized Cardiac Activity: Not visualized Heart Rate:   bpm MSD: 8.0 mm   5 w  4 d CRL:    mm    w    d                  US EDC: Subchorionic hemorrhage:  Moderate subchorionic hemorrhage Maternal uterus/adnexae: No adnexal mass or free fluid. IMPRESSION: Probable early intrauterine gestational sac, but no yolk sac, fetal pole, or cardiac activity yet visualized. Recommend follow-up quantitative B-HCG levels and follow-up US in 14 days to assess viability. This recommendation follows SRU consensus guidelines: Diagnostic Criteria for Nonviable Pregnancy Early in the First Trimester. Malva Limes Engl J Med 2013;  [id number];  [id number]. Moderate subchorionic hemorrhage. Electronically Signed   By: Charlett NoseKevin  Dover M.D.   On: 08/02/2018 22:20    Assessment & Plan:  1. Threatened miscarriage - Initially f/u labs completed in office and US scheduled for 8/11 (first available)  - Beta hCG quant (ref lab) - US OB Transvaginal; Future - CBC  - After evaluation of patient, patient tearful and requesting answers. ED did not schedule appropriate follow up and patient needs US to confirm miscarriage - MAU called to discuss plan of care, patient sent to MAU for evaluation and US.  - US appointment canceled for 8/11 - Address of MAU given to patient and patient encouraged to head over there now, patient verbalizes understanding   Sharyon CableRogers, Kailand Seda C, CNM 08/15/2018 3:38 PM

## 2018-08-15 NOTE — MAU Provider Note (Signed)
Chief Complaint: Abdominal Cramping and Vaginal Bleeding   First Provider Initiated Contact with Patient 08/15/18 1632     SUBJECTIVE HPI: Megan Chambers is a 28 y.o. [id number] at [id number] who presents to Maternity Admissions reporting abdominal cramping & vaginal bleeding. Was seen in the ED on 7/16 for vaginal bleeding. Had an HCG of 18,195 & ultrasound shows empty IUGS. Was told to follow up for her new ob appointment which was today.  Reports persistent vaginal spotting and abdominal cramping. Bleeding is now brown spotting.   Location: abdomen Quality: cramping Severity: 4/10 on pain scale Duration: 2 weeks Timing: intermittent Modifying factors: none Associated signs and symptoms: vaginal bleeding  Past Medical History:  Diagnosis Date  . STD (sexually transmitted disease)    Chlamydia,Trich   OB History  Gravida Para Term Preterm AB Living  4 1 1  0 2 1  SAB TAB Ectopic Multiple Live Births  0 2 0 0 1    # Outcome Date GA Lbr Len/2nd Weight Sex Delivery Anes PTL Lv  4 Current           3 TAB 2015          2 Term 12/31/10     Vag-Spont   LIV  1 TAB 2009           Past Surgical History:  Procedure Laterality Date  . THERAPEUTIC ABORTION     Social History   Socioeconomic History  . Marital status: Single    Spouse name: Not on file  . Number of children: Not on file  . Years of education: Not on file  . Highest education level: Not on file  Occupational History  . Not on file  Social Needs  . Financial resource strain: Not on file  . Food insecurity    Worry: Not on file    Inability: Not on file  . Transportation needs    Medical: Not on file    Non-medical: Not on file  Tobacco Use  . Smoking status: Former Games developermoker  . Smokeless tobacco: Never Used  Substance and Sexual Activity  . Alcohol use: Not Currently    Comment: Occas.  . Drug use: Not Currently    Types: Marijuana  . Sexual activity: Yes    Partners: Female    Comment: 1st intercourse 28 yo-More  than 5 partners  Lifestyle  . Physical activity    Days per week: Not on file    Minutes per session: Not on file  . Stress: Not on file  Relationships  . Social Musicianconnections    Talks on phone: Not on file    Gets together: Not on file    Attends religious service: Not on file    Active member of club or organization: Not on file    Attends meetings of clubs or organizations: Not on file    Relationship status: Not on file  . Intimate partner violence    Fear of current or ex partner: Not on file    Emotionally abused: Not on file    Physically abused: Not on file    Forced sexual activity: Not on file  Other Topics Concern  . Not on file  Social History Narrative  . Not on file   Family History  Problem Relation Age of Onset  . Diabetes Mother   . Diabetes Maternal Grandmother    No current facility-administered medications on file prior to encounter.    Current Outpatient Medications on File Prior to  Encounter  Medication Sig Dispense Refill  . Prenatal Vit-Fe Fumarate-FA (PRENATAL MULTIVITAMIN) TABS tablet Take 1 tablet by mouth daily at 12 noon.     No Known Allergies  I have reviewed patient's Past Medical Hx, Surgical Hx, Family Hx, Social Hx, medications and allergies.   Review of Systems  Constitutional: Negative.   Gastrointestinal: Positive for abdominal pain.  Genitourinary: Positive for vaginal bleeding.    OBJECTIVE Patient Vitals for the past 24 hrs:  BP Pulse Resp SpO2 Height Weight  08/15/18 1623 131/68 84 16 100 % 5' 4.5" (1.638 m) 107.9 kg   Constitutional: Well-developed, well-nourished female in no acute distress.  Cardiovascular: normal rate & rhythm, no murmur Respiratory: normal rate and effort. Lung sounds clear throughout GI: Abd soft, non-tender, Pos BS x 4. No guarding or rebound tenderness MS: Extremities nontender, no edema, normal ROM Neurologic: Alert and oriented x 4.     LAB RESULTS Results for orders placed or performed  during the hospital encounter of 08/15/18 (from the past 24 hour(s))  CBC     Status: None   Collection Time: 08/15/18  4:43 PM  Result Value Ref Range   WBC 7.6 4.0 - 10.5 K/uL   RBC 4.50 3.87 - 5.11 MIL/uL   Hemoglobin 12.6 12.0 - 15.0 g/dL   HCT 96.238.9 95.236.0 - 84.146.0 %   MCV 86.4 80.0 - 100.0 fL   MCH 28.0 26.0 - 34.0 pg   MCHC 32.4 30.0 - 36.0 g/dL   RDW 32.413.5 40.111.5 - 02.715.5 %   Platelets 230 150 - 400 K/uL   nRBC 0.0 0.0 - 0.2 %  hCG, quantitative, pregnancy     Status: Abnormal   Collection Time: 08/15/18  4:43 PM  Result Value Ref Range   hCG, Beta Chain, Quant, S 15,157 (H) <5 mIU/mL    IMAGING [place] Ob Less Than 14 Weeks With Ob Transvaginal  Result Date: 08/15/2018 CLINICAL DATA:  Pregnant patient in first-trimester pregnancy with vaginal bleeding and abdominal cramping. Pregnancy of unknown location. EXAM: OBSTETRIC <14 WK US AND TRANSVAGINAL OB US TECHNIQUE: Both transabdominal and transvaginal ultrasound examinations were performed for complete evaluation of the gestation as well as the maternal uterus, adnexal regions, and pelvic cul-de-sac. Transvaginal technique was performed to assess early pregnancy. COMPARISON:  Obstetric ultrasound 13 days prior 08/02/2018 FINDINGS: Intrauterine gestational sac: Single Yolk sac:  Not Visualized. Embryo:  Not Visualized. Cardiac Activity: Not Visualized. MSD: 14 mm   6 w   1 d Subchorionic hemorrhage:  Small, decreased in size from prior exam. Maternal uterus/adnexae: Cyst in the right ovary measuring approximately 16 mm, which is likely a corpus luteum. Left ovary is normal. Blood flow noted to both ovaries. Trace pelvic free fluid. No suspicious adnexal mass. IMPRESSION: 1. Intrauterine gestational sac but no yolk sac, fetal pole, or cardiac activity. Findings are highly suspicious but not diagnostic for nonviable pregnancy. The gestational sac is slightly larger than on exam 13 days ago. 2. Small subchorionic hemorrhage which has slightly diminished  from prior. 3. Probable corpus luteal cyst in the right ovary. Electronically Signed   By: Narda RutherfordMelanie  Sanford M.D.   On: 08/15/2018 18:50    MAU COURSE Orders Placed This Encounter  Procedures  . US OB LESS THAN 14 WEEKS WITH OB TRANSVAGINAL  . CBC  . hCG, quantitative, pregnancy  . Discharge patient   No orders of the defined types were placed in this encounter.   MDM HCG is 15k down from 17k. Ultrasound  shows continued empty IUGS with minimal growth in 2 weeks.  Images reviewed with Dr. Elonda Husky. Failed pregnancy.   Discussed options for management of incomplete AB including expectant management, Cytotec or D&C. Prefers expectant management at this time. Verbalizes understanding that intervention may become necessary if SAB in not completed spontaneously or if heavy bleeding or infection occur. Also offered MVA in the office.  Patient undecided at this time.   ASSESSMENT 1. Missed abortion   2. Vaginal bleeding in pregnancy, first trimester   3. Abdominal cramping affecting pregnancy     PLAN Discharge home in stable condition. Bleeding precautions Msg to [facility] for f/u appt Pt to call office if she decides on a plan  Follow-up [facility] Follow up.   Why: schedule follow up appointment Contact information: [place] [phone] [place] [facility] Follow up.   Specialty: Obstetrics and Gynecology Why: return for worsening symptoms Contact information: [address] [id number] [place] [phone]         Allergies as of 08/15/2018   No Known Allergies     Medication List    STOP taking these medications   metroNIDAZOLE 500 MG tablet Commonly known as: FLAGYL     TAKE these medications   prenatal multivitamin Tabs tablet Take 1 tablet by mouth daily at 12 noon.        Jorje Guild, NP 08/15/2018  7:34  PM

## 2018-08-15 NOTE — Discharge Instructions (Signed)
Miscarriage °A miscarriage is the loss of an unborn baby (fetus) before the 20th week of pregnancy. Most miscarriages happen during the first 3 months of pregnancy. Sometimes, a miscarriage can happen before a woman knows that she is pregnant. °Having a miscarriage can be an emotional experience. If you have had a miscarriage, talk with your health care provider about any questions you may have about miscarrying, the grieving process, and your plans for future pregnancy. °What are the causes? °A miscarriage may be caused by: °· Problems with the genes or chromosomes of the fetus. These problems make it impossible for the baby to develop normally. They are often the result of random errors that occur early in the development of the baby, and are not passed from parent to child (not inherited). °· Infection of the cervix or uterus. °· Conditions that affect hormone balance in the body. °· Problems with the cervix, such as the cervix opening and thinning before pregnancy is at term (cervical insufficiency). °· Problems with the uterus. These may include: °? A uterus with an abnormal shape. °? Fibroids in the uterus. °? Congenital abnormalities. These are problems that were present at birth. °· Certain medical conditions. °· Smoking, drinking alcohol, or using drugs. °· Injury (trauma). °In many cases, the cause of a miscarriage is not known. °What are the signs or symptoms? °Symptoms of this condition include: °· Vaginal bleeding or spotting, with or without cramps or pain. °· Pain or cramping in the abdomen or lower back. °· Passing fluid, tissue, or blood clots from the vagina. °How is this diagnosed? °This condition may be diagnosed based on: °· A physical exam. °· Ultrasound. °· Blood tests. °· Urine tests. °How is this treated? °Treatment for a miscarriage is sometimes not necessary if you naturally pass all the tissue that was in your uterus. If necessary, this condition may be treated with: °· Dilation and  curettage (D&C). This is a procedure in which the cervix is stretched open and the lining of the uterus (endometrium) is scraped. This is done only if tissue from the fetus or placenta remains in the body (incomplete miscarriage). °· Medicines, such as: °? Antibiotic medicine, to treat infection. °? Medicine to help the body pass any remaining tissue. °? Medicine to reduce (contract) the size of the uterus. These medicines may be given if you have a lot of bleeding. °If you have Rh negative blood and your baby was Rh positive, you will need a shot of a medicine called Rh immunoglobulinto protect your future babies from Rh blood problems. "Rh-negative" and "Rh-positive" refer to whether or not the blood has a specific protein found on the surface of red blood cells (Rh factor). °Follow these instructions at home: °Medicines ° °· Take over-the-counter and prescription medicines only as told by your health care provider. °· If you were prescribed antibiotic medicine, take it as told by your health care provider. Do not stop taking the antibiotic even if you start to feel better. °· Do not take NSAIDs, such as aspirin and ibuprofen, unless they are approved by your health care provider. These medicines can cause bleeding. °Activity °· Rest as directed. Ask your health care provider what activities are safe for you. °· Have someone help with home and family responsibilities during this time. °General instructions °· Keep track of the number of sanitary pads you use each day and how soaked (saturated) they are. Write down this information. °· Monitor the amount of tissue or blood clots that   you pass from your vagina. Save any large amounts of tissue for your health care provider to examine. °· Do not use tampons, douche, or have sex until your health care provider approves. °· To help you and your partner with the process of grieving, talk with your health care provider or seek counseling. °· When you are ready, meet with  your health care provider to discuss any important steps you should take for your health. Also, discuss steps you should take to have a healthy pregnancy in the future. °· Keep all follow-up visits as told by your health care provider. This is important. °Where to find more information °· The American [employer] of Obst[employer]ians [employer] Gynecologists: www.acog.org °· [place] Department of Health and Human Services Office of Women’s Health: www.womenshealth.gov °Contact a health care provider if: °· You have a fever or chills. °· You have a foul smelling vaginal discharge. °· You have more bleeding instead of less. °Get help right away if: °· You have severe cramps or pain in your back or abdomen. °· You pass blood clots or tissue from your vagina that is walnut-sized or larger. °· You soak more than 1 regular sanitary pad in an hour. °· You become light-headed or weak. °· You pass out. °· You have feelings of sadness that take over your thoughts, or you have thoughts of hurting yourself. °Summary °· Most miscarriages happen in the first 3 months of pregnancy. Sometimes miscarriage happens before a woman even knows that she is pregnant. °· Follow your health care provider's instruction for home care. Keep all follow-up appointments. °· To help you and your partner with the process of grieving, talk with your health care provider or seek counseling. °This information is not intended to replace advice given to you by your health care provider. Make sure you discuss any questions you have with your health care provider. °Document Released: 06/29/2000 Document Revised: 04/27/2018 Document Reviewed: 02/09/2016 °Elsevier Patient Education © 2020 [employer] ° ° °Managing Pregnancy Loss °Pregnancy loss can happen any time during a pregnancy. Often the cause is not known. It is rarely because of anything you did. Pregnancy loss in early pregnancy (during the first trimester) is called a miscarriage. This type of pregnancy loss is  the most common. Pregnancy loss that happens after 20 weeks of pregnancy is called fetal demise if the baby's heart stops beating before birth. Fetal demise is much less common. Some women experience spontaneous labor shortly after fetal demise resulting in a stillborn birth (stillbirth). °Any pregnancy loss can be devastating. You will need to recover both physically and emotionally. Most women are able to get pregnant again after a pregnancy loss and deliver a healthy baby. °How to manage emotional recovery ° °Pregnancy loss is very hard emotionally. You may feel many different emotions while you grieve. You may feel sad and angry. You may also feel guilty. It is normal to have periods of crying. Emotional recovery can take longer than physical recovery. It is different for everyone. °Taking these steps can help you in managing this loss: °· Remember that it is unlikely you did anything to cause the pregnancy loss. °· Share your thoughts and feelings with friends, family, and your partner. Remember that your partner is also recovering emotionally. °· Make sure you have a good support system. Do not spend too much time alone. °· Meet with a pregnancy loss counselor or join a pregnancy loss support group. °· Get enough sleep and eat a healthy diet. Return   to regular exercise when you have recovered physically. °· Do not use drugs or alcohol to manage your emotions. °· Consider seeing a mental health professional to help you recover emotionally. °· Ask a friend or loved one to help you decide what to do with any clothing and nursery items you received for your baby. °In the case of a stillbirth, many women benefit from taking additional steps in the grieving process. You may want to: °· Hold your baby after the birth. °· Name your baby. °· Request a birth certificate. °· Create a keepsake such as handprints or footprints. °· Dress your baby and have a picture taken. °· Make funeral arrangements. °· Ask for a baptism  or blessing. °Hospitals have staff members who can help you with all these arrangements. °How to recognize emotional stress °It is normal to have emotional stress after a pregnancy loss. But emotional stress that lasts a long time or becomes severe requires treatment. Watch out for these signs of severe emotional stress: °· Sadness, anger, or guilt that is not going away and is interfering with your normal activities. °· Relationship problems that have occurred or gotten worse since the pregnancy loss. °· Signs of depression that last longer than 2 weeks. These may include: °? Sadness. °? Anxiety. °? Hopelessness. °? Loss of interest in activities you enjoy. °? Inability to concentrate. °? Trouble sleeping or sleeping too much. °? Loss of appetite or overeating. °? Thoughts of death or of hurting yourself. °Follow these instructions at home: °· Take over-the-counter and prescription medicines only as told by your health care provider. °· Rest at home until your energy level returns. Return to your normal activities as told by your health care provider. Ask your health care provider what activities are safe for you. °· When you are ready, meet with your health care provider to discuss steps to take for a future pregnancy. °· Keep all follow-up visits as told by your health care provider. This is important. °Where to find support °· To help you and your partner with the process of grieving, talk with your health care provider or seek counseling. °· Consider meeting with others who have experienced pregnancy loss. Ask your health care provider about support groups and resources. °Where to find more information °· [place] Department of Health and Human Services Office on Women's Health: www.womenshealth.gov °· American Pregnancy Association: www.americanpregnancy.org °Contact a health care provider if: °· You continue to experience grief, sadness, or lack of motivation for everyday activities, and those feelings do not  improve over time. °· You are struggling to recover emotionally, especially if you are using alcohol or substances to help. °Get help right away if: °· You have thoughts of hurting yourself or others. °If you ever feel like you may hurt yourself or others, or have thoughts about taking your own life, get help right away. You can go to your nearest emergency department or call: °· Your local emergency services (911 in the [place] °· A suicide crisis helpline, such as the National Suicide Prevention Lifeline at 1-800-273-8255. This is open 24 hours a day. °Summary °· Any pregnancy loss can be difficult physically and emotionally. °· You may experience many different emotions while you grieve. Emotional recovery can last longer than physical recovery. °· It is normal to have emotional stress after a pregnancy loss. But emotional stress that lasts a long time or becomes severe requires treatment. °· See your health care provider if you are struggling emotionally after a pregnancy   loss. °This information is not intended to replace advice given to you by your health care provider. Make sure you discuss any questions you have with your health care provider. °Document Released: 03/16/2017 Document Revised: 04/25/2018 Document Reviewed: 03/16/2017 °Elsevier Patient Education © 2020 [employer] ° °

## 2018-08-16 LAB — CBC
Hematocrit: 38.2 % (ref 34.0–46.6)
Hemoglobin: 12.4 g/dL (ref 11.1–15.9)
MCH: 27.9 pg (ref 26.6–33.0)
MCHC: 32.5 g/dL (ref 31.5–35.7)
MCV: 86 fL (ref 79–97)
Platelets: 232 10*3/uL (ref 150–450)
RBC: 4.45 x10E6/uL (ref 3.77–5.28)
RDW: 13.3 % (ref 11.7–15.4)
WBC: 6.9 10*3/uL (ref 3.4–10.8)

## 2018-08-16 LAB — BETA HCG QUANT (REF LAB): hCG Quant: 10490 m[IU]/mL

## 2018-08-21 ENCOUNTER — Other Ambulatory Visit: Payer: Self-pay

## 2018-08-21 ENCOUNTER — Inpatient Hospital Stay (HOSPITAL_COMMUNITY)
Admission: AD | Admit: 2018-08-21 | Discharge: 2018-08-21 | Disposition: A | Discharge status: Home / Self Care | Payer: No Typology Code available for payment source | Attending: Obstetrics and Gynecology | Admitting: Obstetrics and Gynecology

## 2018-08-21 ENCOUNTER — Telehealth: Payer: Self-pay

## 2018-08-21 ENCOUNTER — Encounter (HOSPITAL_COMMUNITY): Payer: Self-pay | Admitting: *Deleted

## 2018-08-21 ENCOUNTER — Inpatient Hospital Stay (HOSPITAL_COMMUNITY): Payer: No Typology Code available for payment source

## 2018-08-21 DIAGNOSIS — O26891 Other specified pregnancy related conditions, first trimester: Secondary | ICD-10-CM

## 2018-08-21 DIAGNOSIS — O4690 Antepartum hemorrhage, unspecified, unspecified trimester: Secondary | ICD-10-CM

## 2018-08-21 DIAGNOSIS — Z3A11 11 weeks gestation of pregnancy: Secondary | ICD-10-CM

## 2018-08-21 DIAGNOSIS — R102 Pelvic and perineal pain: Secondary | ICD-10-CM | POA: Diagnosis not present

## 2018-08-21 DIAGNOSIS — O021 Missed abortion: Secondary | ICD-10-CM | POA: Diagnosis present

## 2018-08-21 DIAGNOSIS — O4691 Antepartum hemorrhage, unspecified, first trimester: Secondary | ICD-10-CM | POA: Diagnosis not present

## 2018-08-21 DIAGNOSIS — O26899 Other specified pregnancy related conditions, unspecified trimester: Secondary | ICD-10-CM

## 2018-08-21 HISTORY — DX: Headache, unspecified: R51.9

## 2018-08-21 HISTORY — DX: Depression, unspecified: F32.A

## 2018-08-21 HISTORY — DX: Mental disorder, not otherwise specified: F99

## 2018-08-21 LAB — CBC
HCT: 38.6 % (ref 36.0–46.0)
Hemoglobin: 12.5 g/dL (ref 12.0–15.0)
MCH: 27.7 pg (ref 26.0–34.0)
MCHC: 32.4 g/dL (ref 30.0–36.0)
MCV: 85.6 fL (ref 80.0–100.0)
Platelets: 223 10*3/uL (ref 150–400)
RBC: 4.51 MIL/uL (ref 3.87–5.11)
RDW: 13.5 % (ref 11.5–15.5)
WBC: 6.5 10*3/uL (ref 4.0–10.5)
nRBC: 0 % (ref 0.0–0.2)

## 2018-08-21 LAB — URINALYSIS, ROUTINE W REFLEX MICROSCOPIC
Bacteria, UA: NONE SEEN
Bilirubin Urine: NEGATIVE
Glucose, UA: NEGATIVE mg/dL
Ketones, ur: NEGATIVE mg/dL
Nitrite: NEGATIVE
Protein, ur: 100 mg/dL — AB
RBC / HPF: >50 RBC/hpf — ABNORMAL HIGH (ref 0–5)
Specific Gravity, Urine: 1.01 (ref 1.005–1.030)
pH: 6 (ref 5.0–8.0)

## 2018-08-21 LAB — WET PREP, GENITAL
Clue Cells Wet Prep HPF POC: NONE SEEN
Sperm: NONE SEEN
Trich, Wet Prep: NONE SEEN
WBC, Wet Prep HPF POC: NONE SEEN
Yeast Wet Prep HPF POC: NONE SEEN

## 2018-08-21 LAB — HCG, QUANTITATIVE, PREGNANCY: hCG, Beta Chain, Quant, S: 3033 m[IU]/mL — ABNORMAL HIGH (ref <5)

## 2018-08-21 MED ORDER — OXYCODONE-ACETAMINOPHEN 5-325 MG PO TABS: 1.0000 | ORAL_TABLET | ORAL | 0 refills | Status: DC | PRN

## 2018-08-21 MED ORDER — OXYCODONE-ACETAMINOPHEN 5-325 MG PO TABS
1.0000 | ORAL_TABLET | Freq: Once | ORAL | Status: AC
  Administered 2018-08-21: 17:00:00 1 via ORAL
  Filled 2018-08-21: qty 1

## 2018-08-21 NOTE — Telephone Encounter (Signed)
Pt called and reports she is in a lot of pain and tylenol is not helping. She was diagnosed with MAB on 08/15/18 and discharged to home she chose expected management vs. Cytotec or D&C. Pt reports that she is losing a large amount of blood and large clots since yesterday morning. I advised patient that she needs to be evaluated at the hospital and for her to go back to [facility]. Pt verbalizes understanding and states she will go.

## 2018-08-21 NOTE — [facility] Note (Signed)
In a lot of pain, is having a miscarriage, started passing it on Tues,  Passing large clots, size of a handball.  Tried to get her dr to call in some stronger medication, but they told her to come in .

## 2018-08-21 NOTE — [facility] Note (Signed)
Feeling really  weak

## 2018-08-21 NOTE — Discharge Instructions (Signed)
Miscarriage °A miscarriage is the loss of an unborn baby (fetus) before the 20th week of pregnancy. Most miscarriages happen during the first 3 months of pregnancy. Sometimes, a miscarriage can happen before a woman knows that she is pregnant. °Having a miscarriage can be an emotional experience. If you have had a miscarriage, talk with your health care provider about any questions you may have about miscarrying, the grieving process, and your plans for future pregnancy. °What are the causes? °A miscarriage may be caused by: °· Problems with the genes or chromosomes of the fetus. These problems make it impossible for the baby to develop normally. They are often the result of random errors that occur early in the development of the baby, and are not passed from parent to child (not inherited). °· Infection of the cervix or uterus. °· Conditions that affect hormone balance in the body. °· Problems with the cervix, such as the cervix opening and thinning before pregnancy is at term (cervical insufficiency). °· Problems with the uterus. These may include: °? A uterus with an abnormal shape. °? Fibroids in the uterus. °? Congenital abnormalities. These are problems that were present at birth. °· Certain medical conditions. °· Smoking, drinking alcohol, or using drugs. °· Injury (trauma). °In many cases, the cause of a miscarriage is not known. °What are the signs or symptoms? °Symptoms of this condition include: °· Vaginal bleeding or spotting, with or without cramps or pain. °· Pain or cramping in the abdomen or lower back. °· Passing fluid, tissue, or blood clots from the vagina. °How is this diagnosed? °This condition may be diagnosed based on: °· A physical exam. °· Ultrasound. °· Blood tests. °· Urine tests. °How is this treated? °Treatment for a miscarriage is sometimes not necessary if you naturally pass all the tissue that was in your uterus. If necessary, this condition may be treated with: °· Dilation and  curettage (D&C). This is a procedure in which the cervix is stretched open and the lining of the uterus (endometrium) is scraped. This is done only if tissue from the fetus or placenta remains in the body (incomplete miscarriage). °· Medicines, such as: °? Antibiotic medicine, to treat infection. °? Medicine to help the body pass any remaining tissue. °? Medicine to reduce (contract) the size of the uterus. These medicines may be given if you have a lot of bleeding. °If you have Rh negative blood and your baby was Rh positive, you will need a shot of a medicine called Rh immunoglobulinto protect your future babies from Rh blood problems. "Rh-negative" and "Rh-positive" refer to whether or not the blood has a specific protein found on the surface of red blood cells (Rh factor). °Follow these instructions at home: °Medicines ° °· Take over-the-counter and prescription medicines only as told by your health care provider. °· If you were prescribed antibiotic medicine, take it as told by your health care provider. Do not stop taking the antibiotic even if you start to feel better. °· Do not take NSAIDs, such as aspirin and ibuprofen, unless they are approved by your health care provider. These medicines can cause bleeding. °Activity °· Rest as directed. Ask your health care provider what activities are safe for you. °· Have someone help with home and family responsibilities during this time. °General instructions °· Keep track of the number of sanitary pads you use each day and how soaked (saturated) they are. Write down this information. °· Monitor the amount of tissue or blood clots that   you pass from your vagina. Save any large amounts of tissue for your health care provider to examine. °· Do not use tampons, douche, or have sex until your health care provider approves. °· To help you and your partner with the process of grieving, talk with your health care provider or seek counseling. °· When you are ready, meet with  your health care provider to discuss any important steps you should take for your health. Also, discuss steps you should take to have a healthy pregnancy in the future. °· Keep all follow-up visits as told by your health care provider. This is important. °Where to find more information °· The American [employer] of Ob[employer]etricians and Gynecologists: www.acog.org °· [place] Department of Health and Human Services Office of Women’s Health: www.womenshealth.gov °Contact a health care provider if: °· You have a fever or chills. °· You have a foul smelling vaginal discharge. °· You have more bleeding instead of less. °Get help right away if: °· You have severe cramps or pain in your back or abdomen. °· You pass blood clots or tissue from your vagina that is walnut-sized or larger. °· You soak more than 1 regular sanitary pad in an hour. °· You become light-headed or weak. °· You pass out. °· You have feelings of sadness that take over your thoughts, or you have thoughts of hurting yourself. °Summary °· Most miscarriages happen in the first 3 months of pregnancy. Sometimes miscarriage happens before a woman even knows that she is pregnant. °· Follow your health care provider's instruction for home care. Keep all follow-up appointments. °· To help you and your partner with the process of grieving, talk with your health care provider or seek counseling. °This information is not intended to replace advice given to you by your health care provider. Make sure you discuss any questions you have with your health care provider. °Document Released: 06/29/2000 Document Revised: 04/27/2018 Document Reviewed: 02/09/2016 °Elsevier Patient Education © 2020 [employer] ° °

## 2018-08-21 NOTE — MAU Provider Note (Addendum)
History     CSN: [id number]  Arrival date and time: 08/21/18 1436   First Provider Initiated Contact with Patient 08/21/18 1642      Chief Complaint  Patient presents with  . Vaginal Bleeding  . Abdominal Pain   [name] is a 28 y.o. [id number] at [redacted]w[redacted]d who presents to MAU for vaginal bleeding which began 08/15/2018, when patient first came to MAU and was diagnosed with a missed AB. Pt reports on 08/17/2018 she passed "something really big" but doesn't know if it was blood or tissue. Pt reports that yesterday she started passing large blood clots with significant pelvic pain that is worsening. Pt reports pain is 8/10. Pt reports she tried Tylenol 1000mg  two days ago and "BC Powder" yesterday, but pt reports she has not taken anything for pain today. Pt reports the Tylenol and BC Powder did not work for her.  Passing blood clots? yes Blood soaking clothes? no Lightheaded/dizzy? no Significant pelvic pain or cramping? yes Passed any tissue? unsure  Current pregnancy problems? missed AB Blood Type? B Positive Allergies? NKDA Current medications? none Current PNC & next appt? [facility], next appt 08/22/2018  Pt denies vaginal discharge/odor/itching. Pt denies N/V, abdominal pain, constipation, diarrhea, or urinary problems. Pt denies fever, chills, fatigue, sweating or changes in appetite. Pt denies SOB or chest pain. Pt denies dizziness, HA, light-headedness, weakness.    OB History    Gravida  5   Para  1   Term  1   Preterm  0   AB  3   Living  1     SAB  0   TAB  3   Ectopic  0   Multiple  0   Live Births  1           Past Medical History:  Diagnosis Date  . Depression    no on meds, not doing well  . Headache   . Mental disorder    Bipolar, off meds for about a year  . STD (sexually transmitted disease)    Chlamydia,Trich    Past Surgical History:  Procedure Laterality Date  . THERAPEUTIC ABORTION      Family History  Problem  Relation Age of Onset  . Diabetes Mother   . Diabetes Maternal Grandmother     Social History   Tobacco Use  . Smoking status: Former Research scientist (life sciences)  . Smokeless tobacco: Never Used  Substance Use Topics  . Alcohol use: Not Currently    Comment: Occas.  . Drug use: Not Currently    Types: Marijuana    Allergies: No Known Allergies  Medications Prior to Admission  Medication Sig Dispense Refill Last Dose  . acetaminophen (TYLENOL) 500 MG tablet Take 1,000 mg by mouth every 6 (six) hours as needed.   Past Week at Unknown time  . Prenatal Vit-Fe Fumarate-FA (PRENATAL MULTIVITAMIN) TABS tablet Take 1 tablet by mouth daily at 12 noon.   Past Week at Unknown time    Review of Systems  Constitutional: Negative for chills, diaphoresis, fatigue and fever.  Respiratory: Negative for shortness of breath.   Cardiovascular: Negative for chest pain.  Gastrointestinal: Negative for abdominal pain, constipation, diarrhea, nausea and vomiting.  Genitourinary: Positive for pelvic pain and vaginal bleeding. Negative for dysuria, flank pain, frequency, urgency and vaginal discharge.  Neurological: Negative for dizziness, weakness, light-headedness and headaches.   Physical Exam   Blood pressure 110/70, pulse 83, temperature 98.5 F (36.9 C), temperature source Oral, resp. rate  18, height 5' 4.5" (1.638 m), weight 105.2 kg, last menstrual period 12/24/2017, SpO2 99 %.  Patient Vitals for the past 24 hrs:  BP Temp Temp src Pulse Resp SpO2 Height Weight  08/21/18 1618 110/70 - - 83 - - - -  08/21/18 1537 124/62 98.5 F (36.9 C) Oral 98 18 99 % 5' 4.5" (1.638 m) 105.2 kg   Physical Exam  Constitutional: She is oriented to person, place, and time. She appears well-developed and well-nourished. No distress.  HENT:  Head: Normocephalic and atraumatic.  Respiratory: Effort normal.  GI: Soft. She exhibits no distension and no mass. There is no abdominal tenderness. There is no rebound and no guarding.   Genitourinary: There is no rash, tenderness or lesion on the right labia. There is no rash, tenderness or lesion on the left labia. Uterus is not enlarged and not tender. Cervix exhibits no motion tenderness, no discharge and no friability. Right adnexum displays no mass, no tenderness and no fullness. Left adnexum displays no mass, no tenderness and no fullness.    Vaginal bleeding (minimal bleeding in vagina, clot present at external os, unable to be removed with gentle traction) present.     No vaginal discharge or tenderness.  There is bleeding (minimal bleeding in vagina, clot present at external os, unable to be removed with gentle traction) in the vagina. No tenderness in the vagina.  Neurological: She is alert and oriented to person, place, and time.  Skin: Skin is warm and dry. She is not diaphoretic.  Psychiatric: She has a normal mood and affect. Her behavior is normal. Judgment and thought content normal.   Results for orders placed or performed during the hospital encounter of 08/21/18 (from the past 24 hour(s))  CBC     Status: None   Collection Time: 08/21/18  5:14 PM  Result Value Ref Range   WBC 6.5 4.0 - 10.5 K/uL   RBC 4.51 3.87 - 5.11 MIL/uL   Hemoglobin 12.5 12.0 - 15.0 g/dL   HCT 16.138.6 09.636.0 - 04.546.0 %   MCV 85.6 80.0 - 100.0 fL   MCH 27.7 26.0 - 34.0 pg   MCHC 32.4 30.0 - 36.0 g/dL   RDW 40.913.5 81.111.5 - 91.415.5 %   Platelets 223 150 - 400 K/uL   nRBC 0.0 0.0 - 0.2 %  hCG, quantitative, pregnancy     Status: Abnormal   Collection Time: 08/21/18  5:14 PM  Result Value Ref Range   hCG, Beta Chain, Quant, S 3,033 (H) <5 mIU/mL  Urinalysis, Routine w reflex microscopic     Status: Abnormal   Collection Time: 08/21/18  6:42 PM  Result Value Ref Range   Color, Urine RED (A) YELLOW   APPearance HAZY (A) CLEAR   Specific Gravity, Urine 1.010 1.005 - 1.030   pH 6.0 5.0 - 8.0   Glucose, UA NEGATIVE NEGATIVE mg/dL   Hgb urine dipstick LARGE (A) NEGATIVE   Bilirubin Urine NEGATIVE  NEGATIVE   Ketones, ur NEGATIVE NEGATIVE mg/dL   Protein, ur 782100 (A) NEGATIVE mg/dL   Nitrite NEGATIVE NEGATIVE   Leukocytes,Ua TRACE (A) NEGATIVE   RBC / HPF >50 (H) 0 - 5 RBC/hpf   Bacteria, UA NONE SEEN NONE SEEN   Squamous Epithelial / LPF 0-5 0 - 5   Crystals PRESENT (A) NEGATIVE  Wet prep, genital     Status: None   Collection Time: 08/21/18  6:42 PM   Specimen: Vaginal  Result Value Ref Range  Yeast Wet Prep HPF POC NONE SEEN NONE SEEN   Trich, Wet Prep NONE SEEN NONE SEEN   Clue Cells Wet Prep HPF POC NONE SEEN NONE SEEN   WBC, Wet Prep HPF POC NONE SEEN NONE SEEN   Sperm NONE SEEN    Dg Chest 2 View  Result Date: 08/02/2018 CLINICAL DATA:  2 month history of mid chest pain. EXAM: CHEST - 2 VIEW COMPARISON:  None. FINDINGS: The heart size and mediastinal contours are within normal limits. Both lungs are clear. The visualized skeletal structures are unremarkable. IMPRESSION: No active cardiopulmonary disease. Electronically Signed   By: Kennith Center M.D.   On: 08/02/2018 18:59   US Ob Comp < 14 Wks  Result Date: 08/02/2018 CLINICAL DATA:  Vaginal bleeding EXAM: OBSTETRIC <14 WK [place] AND TRANSVAGINAL OB US TECHNIQUE: Both transabdominal and transvaginal ultrasound examinations were performed for complete evaluation of the gestation as well as the maternal uterus, adnexal regions, and pelvic cul-de-sac. Transvaginal technique was performed to assess early pregnancy. COMPARISON:  None. FINDINGS: Intrauterine gestational sac: Single Yolk sac:  Not visualized Embryo:  Not visualized Cardiac Activity: Not visualized Heart Rate:   bpm MSD: 8.0 mm   5 w   4 d CRL:    mm    w    d                  [place] EDC: Subchorionic hemorrhage:  Moderate subchorionic hemorrhage Maternal uterus/adnexae: No adnexal mass or free fluid. IMPRESSION: Probable early intrauterine gestational sac, but no yolk sac, fetal pole, or cardiac activity yet visualized. Recommend follow-up quantitative B-HCG levels and  follow-up US in 14 days to assess viability. This recommendation follows SRU consensus guidelines: Diagnostic Criteria for Nonviable Pregnancy Early in the First Trimester. Malva Limes Med 2013; 151:7616-07. Moderate subchorionic hemorrhage. Electronically Signed   By: Charlett Nose M.D.   On: 08/02/2018 22:20   US Ob Transvaginal  Result Date: 08/02/2018 CLINICAL DATA:  Vaginal bleeding EXAM: OBSTETRIC <14 WK [place] AND TRANSVAGINAL OB US TECHNIQUE: Both transabdominal and transvaginal ultrasound examinations were performed for complete evaluation of the gestation as well as the maternal uterus, adnexal regions, and pelvic cul-de-sac. Transvaginal technique was performed to assess early pregnancy. COMPARISON:  None. FINDINGS: Intrauterine gestational sac: Single Yolk sac:  Not visualized Embryo:  Not visualized Cardiac Activity: Not visualized Heart Rate:   bpm MSD: 8.0 mm   5 w   4 d CRL:    mm    w    d                  [place] EDC: Subchorionic hemorrhage:  Moderate subchorionic hemorrhage Maternal uterus/adnexae: No adnexal mass or free fluid. IMPRESSION: Probable early intrauterine gestational sac, but no yolk sac, fetal pole, or cardiac activity yet visualized. Recommend follow-up quantitative B-HCG levels and follow-up US in 14 days to assess viability. This recommendation follows SRU consensus guidelines: Diagnostic Criteria for Nonviable Pregnancy Early in the First Trimester. Malva Limes Med 2013; 371:0626-94. Moderate subchorionic hemorrhage. Electronically Signed   By: Charlett Nose M.D.   On: 08/02/2018 22:20   US Ob Less Than 14 Weeks With Ob Transvaginal  Result Date: 08/15/2018 CLINICAL DATA:  Pregnant patient in first-trimester pregnancy with vaginal bleeding and abdominal cramping. Pregnancy of unknown location. EXAM: OBSTETRIC <14 WK [place] AND TRANSVAGINAL OB US TECHNIQUE: Both transabdominal and transvaginal ultrasound examinations were performed for complete evaluation of the gestation as well as the  maternal uterus, adnexal regions, and pelvic cul-de-sac. Transvaginal technique was performed to assess early pregnancy. COMPARISON:  Obstetric ultrasound 13 days prior 08/02/2018 FINDINGS: Intrauterine gestational sac: Single Yolk sac:  Not Visualized. Embryo:  Not Visualized. Cardiac Activity: Not Visualized. MSD: 14 mm   6 w   1 d Subchorionic hemorrhage:  Small, decreased in size from prior exam. Maternal uterus/adnexae: Cyst in the right ovary measuring approximately 16 mm, which is likely a corpus luteum. Left ovary is normal. Blood flow noted to both ovaries. Trace pelvic free fluid. No suspicious adnexal mass. IMPRESSION: 1. Intrauterine gestational sac but no yolk sac, fetal pole, or cardiac activity. Findings are highly suspicious but not diagnostic for nonviable pregnancy. The gestational sac is slightly larger than on exam 13 days ago. 2. Small subchorionic hemorrhage which has slightly diminished from prior. 3. Probable corpus luteal cyst in the right ovary. Electronically Signed   By: Narda RutherfordMelanie  Sanford M.D.   On: 08/15/2018 18:50   MAU Course  Procedures  MDM -missed AB with VB and 8/10 pain -CBC: WNL (H/H 12.5/38.6) -hCG 3,033 (was 15,157 on 08/15/2018) -US: pending at time of care transfer -UA: hazy/lg hgb/100PRO/trace leuks/crystals present, sending urine for culture -WetPrep: WNL -GC/CT collected -5mg  Percocet given, pt reports pain is now 2/10 -[facility]  [name], Odie SeraNicole E, NP  8:09 PM 08/21/2018  Orders Placed This Encounter  Procedures  . Wet prep, genital    Standing Status:   Standing    Number of Occurrences:   1  . Culture, OB Urine    Standing Status:   Standing    Number of Occurrences:   1  . US OB Transvaginal    Pt is undergoing expectant management for missed AB at this time.    Standing Status:   Standing    Number of Occurrences:   1    Order Specific Question:   Symptom/Reason for Exam    Answer:   Pelvic pain in pregnancy  [[id number]][[id number]]    Order Specific Question:   Symptom/Reason for Exam    Answer:   Vaginal bleeding in pregnancy [[id number]]  . Urinalysis, Routine w reflex microscopic    Standing Status:   Standing    Number of Occurrences:   1  . CBC    Standing Status:   Standing    Number of Occurrences:   1  . hCG, quantitative, pregnancy    Standing Status:   Standing    Number of Occurrences:   1   Meds ordered this encounter  Medications  . oxyCODONE-acetaminophen (PERCOCET/ROXICET) 5-325 MG per tablet 1 tablet   Assessment and Plan   Reassessment (9:10 PM) -Care assumed of patient. -In room to discuss preliminary US results and labs. -Patient asked to reiterate bleeding and states she was passing clots "the size of an orange" yesterday.  However, the bleeding has since gotten lighter, but remains heavy in her opinion. -She states she continues to pass clots, but they are now the size of "big grape."  -Patient questions need for D&C and informed that due to stable labs and minimal bleeding now, D&C is not indicated.  -Patient requests prescription stating she was taking tylenol and BC powder without improvement. -Will give/send script for Percocet 5/325 Disp 6, RF 0 -Informed that appt for tomorrow will be rescheduled for repeat quant in 1 week and patient can call, the day before, to report any problems or need to request a provider. -Patient without questions or  concerns. -Encouraged to call or return to MAU if symptoms worsen or with the onset of new symptoms. -Discharged to home in stable condition.

## 2018-08-21 NOTE — MAU Note (Signed)
Patient called out requesting ginger ale.  RN referred to [name], NP whether the patient was NPO and she stated to keep the patient NPO until her final ultrasound result came back.  Patient notified and stated she had received something earlier by the previous RN.  RN returned to NP to relay this info-was told still to hold food/drink until results come back.  RN returned to patient to report what NP said.  Patient became agitated with the response and started cursing at RN.  RN provided reassurance and stated I would let her know when her results came back and provide her with ginger ale.  Patient called out approximately 1 hour later stating it was ridiculous that she still hasn't received anything to drink and began cursing at the staff and asking to speak to administration.  Patient's ultrasound results still are not back.  RN reviewed situation with [name], CNM who assumed care of patient.  RN also spoke with House Coverage to give a rundown of the situation and to come speak with the patient.  Patient was appropriate with house coverage and security and was provided with a card for the office of patient experience.  CNM went in to speak with the patient and discussed the delay in care.  CNM put in patient's discharge and patient was provided with a beverage.  Patient needed a second card because she lost the first one.

## 2018-08-22 ENCOUNTER — Ambulatory Visit: Payer: 59 | Admitting: Obstetrics & Gynecology

## 2018-08-23 ENCOUNTER — Ambulatory Visit: Payer: 59 | Admitting: Certified Nurse Midwife

## 2018-08-23 LAB — CULTURE, OB URINE: Culture: <10000 — AB

## 2018-08-23 LAB — GC/CHLAMYDIA PROBE AMP (~~LOC~~) NOT AT ARMC
Chlamydia: NEGATIVE
Neisseria Gonorrhea: NEGATIVE

## 2018-08-28 ENCOUNTER — Other Ambulatory Visit: Payer: 59

## 2018-08-28 ENCOUNTER — Ambulatory Visit (HOSPITAL_COMMUNITY): Payer: 59

## 2018-08-30 ENCOUNTER — Telehealth: Payer: Self-pay | Admitting: Obstetrics

## 2018-09-11 ENCOUNTER — Other Ambulatory Visit: Payer: 59

## 2018-10-08 ENCOUNTER — Encounter: Payer: Self-pay | Admitting: Gynecology

## 2018-11-25 ENCOUNTER — Emergency Department (HOSPITAL_COMMUNITY)
Admission: EM | Admit: 2018-11-25 | Discharge: 2018-11-25 | Disposition: A | Discharge status: Home / Self Care | Payer: 59 | Attending: Emergency Medicine | Admitting: Emergency Medicine

## 2018-11-25 ENCOUNTER — Encounter (HOSPITAL_COMMUNITY): Payer: Self-pay | Admitting: *Deleted

## 2018-11-25 ENCOUNTER — Other Ambulatory Visit: Payer: Self-pay

## 2018-11-25 DIAGNOSIS — Z711 Person with feared health complaint in whom no diagnosis is made: Secondary | ICD-10-CM

## 2018-11-25 DIAGNOSIS — Z113 Encounter for screening for infections with a predominantly sexual mode of transmission: Secondary | ICD-10-CM

## 2018-11-25 DIAGNOSIS — F17200 Nicotine dependence, unspecified, uncomplicated: Secondary | ICD-10-CM | POA: Insufficient documentation

## 2018-11-25 DIAGNOSIS — B9689 Other specified bacterial agents as the cause of diseases classified elsewhere: Secondary | ICD-10-CM | POA: Insufficient documentation

## 2018-11-25 DIAGNOSIS — N760 Acute vaginitis: Secondary | ICD-10-CM

## 2018-11-25 DIAGNOSIS — Z202 Contact with and (suspected) exposure to infections with a predominantly sexual mode of transmission: Secondary | ICD-10-CM | POA: Diagnosis present

## 2018-11-25 LAB — WET PREP, GENITAL
Sperm: NONE SEEN
Trich, Wet Prep: NONE SEEN
Yeast Wet Prep HPF POC: NONE SEEN

## 2018-11-25 LAB — URINALYSIS, ROUTINE W REFLEX MICROSCOPIC
Bilirubin Urine: 0 — AB
Glucose, UA: 0 mg/dL — AB
Hgb urine dipstick: 1 — AB
Ketones, ur: 0 mg/dL — AB
Leukocytes,Ua: 1 — AB
Nitrite: 0 — AB
Protein, ur: 0 mg/dL — AB
Specific Gravity, Urine: 1.015 (ref 1.005–1.030)
pH: 7 (ref 5.0–8.0)

## 2018-11-25 LAB — POC URINE PREG, ED: Preg Test, Ur: NEGATIVE

## 2018-11-25 MED ORDER — CEFTRIAXONE SODIUM 250 MG IJ SOLR
250.0000 mg | Freq: Once | INTRAMUSCULAR | Status: AC
  Administered 2018-11-25: 250 mg via INTRAMUSCULAR
  Filled 2018-11-25: qty 250

## 2018-11-25 MED ORDER — LIDOCAINE HCL (PF) 1 % IJ SOLN
INTRAMUSCULAR | Status: AC
  Administered 2018-11-25: 14:00:00
  Filled 2018-11-25: qty 30

## 2018-11-25 MED ORDER — METRONIDAZOLE 500 MG PO TABS: 500.0000 mg | ORAL_TABLET | Freq: Two times a day (BID) | ORAL | 0 refills | Status: AC

## 2018-11-25 MED ORDER — AZITHROMYCIN 250 MG PO TABS
1000.0000 mg | ORAL_TABLET | Freq: Once | ORAL | Status: AC
  Administered 2018-11-25: 1000 mg via ORAL
  Filled 2018-11-25: qty 4

## 2018-11-25 NOTE — Discharge Instructions (Addendum)
You were seen in ER for concern about STD and vaginal discharge  Swabs showed bacterial vaginosis, no trichomonas or yeast. Take metronidazole (flagyl) as prescribed.   Gonorrhea and chlamydia swabs are pending, you were treated for these.   No sex for the next 7 days after treatment.  Notify all partners you were tested and may have some symptoms, they need testing and treatment.  Return to ER for worsening symptoms, abdominal or pelvic pain, fevers

## 2018-11-25 NOTE — ED Provider Notes (Signed)
[facility] DEPT Provider Note   CSN: [id number] Arrival date & time: 11/25/18  8466     History   Chief Complaint Chief Complaint  Patient presents with  . SEXUALLY TRANSMITTED DISEASE    HPI Megan Chambers is a 28 y.o. female with pertinent PMH of previous STD presents to the ER for concern of STD.  Reports 1 month ago before her menses she began having mild external vaginal irritation and increased vaginal discharge.  States initially she thought this was normal because it typically happens right before her menses.  She called her doctor regarding her symptoms and her doctor sent in a prescription for yeast and bacterial vaginosis which she took approximately 2 to 3 weeks ago.  She had her period around 10/20 and states her symptoms persisted which concerned her.  Now she is having large amounts of white vaginal discharge, some of it will drop out into the toilet.  There is associated odor and external skin irritation.  Unfortunately, she found out her fianc had been sleeping around recently.  She is sexually active with this female partner without condom use.  States she could be pregnant.  No interventions.  She felt a slight twinge of discomfort in right mid/lower abdomen last night but this resolved. No modifying factors.  Denies fever, nausea, vomiting, abdominal pain, dysuria, flank pain.     HPI  Past Medical History:  Diagnosis Date  . Depression    no on meds, not doing well  . Headache   . Mental disorder    Bipolar, off meds for about a year  . STD (sexually transmitted disease)    Chlamydia,Trich    There are no active problems to display for this patient.   Past Surgical History:  Procedure Laterality Date  . THERAPEUTIC ABORTION       OB History    Gravida  5   Para  1   Term  1   Preterm  0   AB  3   Living  1     SAB  0   TAB  3   Ectopic  0   Multiple  0   Live Births  1            Home Medications     Prior to Admission medications   Medication Sig Start Date End Date Taking? Authorizing Provider  acetaminophen (TYLENOL) 500 MG tablet Take 1,000 mg by mouth every 6 (six) hours as needed.    [provider]  metroNIDAZOLE (FLAGYL) 500 MG tablet Take 1 tablet (500 mg total) by mouth 2 (two) times daily for 7 days. 11/25/18 12/02/18  Kinnie Feil, PA-C  oxyCODONE-acetaminophen (PERCOCET/ROXICET) 5-325 MG tablet Take 1-2 tablets by mouth every 4 (four) hours as needed for severe pain. 08/21/18   Gavin Pound, CNM  Prenatal Vit-Fe Fumarate-FA (PRENATAL MULTIVITAMIN) TABS tablet Take 1 tablet by mouth daily at 12 noon.    [provider]    Family History Family History  Problem Relation Age of Onset  . Diabetes Mother   . Diabetes Maternal Grandmother     Social History Social History   Tobacco Use  . Smoking status: Current Every Day Smoker  . Smokeless tobacco: Never Used  Substance Use Topics  . Alcohol use: Not Currently    Comment: Occas.  . Drug use: Not Currently    Types: Marijuana     Allergies   Patient has no known allergies.  Review of Systems Review of Systems  Genitourinary: Positive for vaginal discharge and vaginal pain (irritation).  All other systems reviewed and are negative.    Physical Exam Updated Vital Signs BP 134/83 (BP Location: Left Arm)   Pulse (!) 103   Temp 98.5 F (36.9 C) (Oral)   Resp 16   Ht 5' 4.5" (1.638 m)   Wt 104.3 kg   LMP 11/06/2018   SpO2 100%   Breastfeeding Unknown   BMI 38.87 kg/m   Physical Exam Vitals signs and nursing note reviewed.  Constitutional:      Appearance: She is well-developed.     Comments: Non toxic in NAD  HENT:     Head: Normocephalic and atraumatic.     Nose: Nose normal.  Eyes:     Conjunctiva/sclera: Conjunctivae normal.  Neck:     Musculoskeletal: Normal range of motion.  Cardiovascular:     Rate and Rhythm: Normal rate and regular rhythm.  Pulmonary:      Effort: Pulmonary effort is normal.     Breath sounds: Normal breath sounds.  Abdominal:     General: Bowel sounds are normal.     Palpations: Abdomen is soft.     Tenderness: There is no abdominal tenderness.     Comments: No G/R/R. No suprapubic or CVA tenderness. Negative Murphy's and McBurney's. Active BS to lower quadrants.   Genitourinary:    Vagina: Vaginal discharge present.     Comments:  Exam performed with EMT at bedside for assistance. External genitalia without lesions or other skin abnormalities.  No groin lymphadenopathy.  Vaginal mucosa and cervix pink without lesions.  Copious white runny discharge in vaginal vault and around cervix. Cervix visualized, closed without lesions or friability.  No CMT.  Nonpalpable, nontender adnexa.  Perianal skin normal without lesions. Musculoskeletal: Normal range of motion.  Skin:    General: Skin is warm and dry.     Capillary Refill: Capillary refill takes less than 2 seconds.  Neurological:     Mental Status: She is alert.  Psychiatric:        Behavior: Behavior normal.      ED Treatments / Results  Labs (all labs ordered are listed, but only abnormal results are displayed) Labs Reviewed  WET PREP, GENITAL - Abnormal; Notable for the following components:      Result Value   Clue Cells Wet Prep HPF POC PRESENT (*)    WBC, Wet Prep HPF POC FEW (*)    All other components within normal limits  URINALYSIS, ROUTINE W REFLEX MICROSCOPIC - Abnormal; Notable for the following components:   Color, Urine YELLOW (*)    APPearance HAZY (*)    Glucose, UA 0 (*)    Hgb urine dipstick 1 (*)    Bilirubin Urine 0 (*)    Ketones, ur 0 (*)    Protein, ur 0 (*)    Nitrite 0 (*)    Leukocytes,Ua 1 (*)    All other components within normal limits  URINE CULTURE  POC URINE PREG, ED  GC/CHLAMYDIA PROBE AMP ([place]    EKG None  Radiology No results found.  Procedures Procedures (including critical care time)   Medications Ordered in ED Medications  cefTRIAXone (ROCEPHIN) injection 250 mg (has no administration in time range)  azithromycin (ZITHROMAX) tablet 1,000 mg (has no administration in time range)     Initial Impression / Assessment and Plan / ED Course  I have reviewed the triage  vital signs and the nursing notes.  Pertinent labs & imaging results that were available during my care of the patient were reviewed by me and considered in my medical decision making (see chart for details).  Clinical Course as of Nov 24 1329  Wynelle Link Nov 25, 2018  1221 Preg Test, Ur: NEGATIVE [CG]  1313 Clue Cells Wet Prep HPF POC(!): PRESENT [CG]  1313 WBC, Wet Prep HPF POC(!): FEW [CG]    Clinical Course User Index [CG] Liberty Handy, PA-C    Wet prep shows clue cells, will treat given symptoms.  Pregnancy negative. Empirically treated for gonorrhea and chlamydia here given possible exposure.  No alarm symptoms to suggest PID or other complication from STD. No symptoms of UTI.  Appropriate for dc. Return precautions given.   Final Clinical Impressions(s) / ED Diagnoses   Final diagnoses:  Encounter for screening examination for sexually transmitted disease  Bacterial vaginosis  Concern about STD in female without diagnosis    ED Discharge Orders         Ordered    metroNIDAZOLE (FLAGYL) 500 MG tablet  2 times daily     11/25/18 1328           Liberty Handy, [place] 11/25/18 1331    Virgina Norfolk, DO 11/25/18 1410

## 2018-11-25 NOTE — ED Triage Notes (Signed)
Pt states she wants a STD check due to discharge and odor, just found out s/o has been sleeping around, LMP Oct 20

## 2018-11-26 LAB — URINE CULTURE: Culture: NO GROWTH

## 2018-11-27 LAB — GC/CHLAMYDIA PROBE AMP (~~LOC~~) NOT AT ARMC
Chlamydia: POSITIVE — AB
Neisseria Gonorrhea: NEGATIVE

## 2019-01-23 ENCOUNTER — Ambulatory Visit: Payer: Medicaid Other | Attending: Internal Medicine

## 2019-01-23 DIAGNOSIS — Z20822 Contact with and (suspected) exposure to covid-19: Secondary | ICD-10-CM

## 2019-01-24 LAB — NOVEL CORONAVIRUS, NAA: SARS-CoV-2, NAA: NOT DETECTED

## 2019-02-01 ENCOUNTER — Other Ambulatory Visit: Payer: Self-pay

## 2019-02-01 ENCOUNTER — Emergency Department (HOSPITAL_COMMUNITY)
Admission: EM | Admit: 2019-02-01 | Discharge: 2019-02-01 | Disposition: A | Discharge status: Home / Self Care | Payer: BC Managed Care – PPO | Attending: Emergency Medicine | Admitting: Emergency Medicine

## 2019-02-01 ENCOUNTER — Encounter (HOSPITAL_COMMUNITY): Payer: Self-pay | Admitting: Emergency Medicine

## 2019-02-01 ENCOUNTER — Emergency Department (HOSPITAL_COMMUNITY): Payer: BC Managed Care – PPO

## 2019-02-01 DIAGNOSIS — O99891 Other specified diseases and conditions complicating pregnancy: Secondary | ICD-10-CM | POA: Diagnosis present

## 2019-02-01 DIAGNOSIS — Z79899 Other long term (current) drug therapy: Secondary | ICD-10-CM | POA: Insufficient documentation

## 2019-02-01 DIAGNOSIS — U071 COVID-19: Secondary | ICD-10-CM | POA: Diagnosis not present

## 2019-02-01 DIAGNOSIS — O99330 Smoking (tobacco) complicating pregnancy, unspecified trimester: Secondary | ICD-10-CM | POA: Diagnosis not present

## 2019-02-01 DIAGNOSIS — F17210 Nicotine dependence, cigarettes, uncomplicated: Secondary | ICD-10-CM | POA: Diagnosis not present

## 2019-02-01 DIAGNOSIS — Z3A00 Weeks of gestation of pregnancy not specified: Secondary | ICD-10-CM | POA: Diagnosis not present

## 2019-02-01 DIAGNOSIS — R5383 Other fatigue: Secondary | ICD-10-CM | POA: Diagnosis not present

## 2019-02-01 DIAGNOSIS — Z3490 Encounter for supervision of normal pregnancy, unspecified, unspecified trimester: Secondary | ICD-10-CM

## 2019-02-01 DIAGNOSIS — O98511 Other viral diseases complicating pregnancy, first trimester: Secondary | ICD-10-CM | POA: Diagnosis not present

## 2019-02-01 LAB — PREGNANCY, URINE: Preg Test, Ur: POSITIVE — AB

## 2019-02-01 LAB — POC SARS CORONAVIRUS 2 AG -  ED: SARS Coronavirus 2 Ag: POSITIVE — AB

## 2019-02-01 NOTE — Discharge Instructions (Addendum)
You were seen in the emergency department for body aches fatigue chest pain shortness of breath.  Your Covid test was positive.  Your chest x-ray did not show any signs of pneumonia.  Your urine pregnancy test was positive.  You can use Tylenol as needed for pain, drink fluids, get rest and isolate.  Please schedule a follow-up with your OB.  Return to the emergency department if any worsening shortness of breath or other concerning symptoms.

## 2019-02-01 NOTE — ED Provider Notes (Signed)
[facility]-EMERGENCY DEPT Provider Note   CSN: [id number] Arrival date & time: 02/01/19  1550     History Chief Complaint  Patient presents with  . Fatigue  . Generalized Body Aches  . Cough    Megan Chambers is a 29 y.o. female.  She said she has had fatigue for about a week and a half.  This week she is also noticed a nonproductive cough body aches arthralgias headache ear pain.  Also had a few episodes of diarrhea that is resolved.  No known fevers or shortness of breath.  No urinary symptoms.  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  The history is provided by the patient.  Influenza Presenting symptoms: cough, diarrhea, fatigue, headache, myalgias, nausea and rhinorrhea   Presenting symptoms: no vomiting   Fatigue:    Severity:  Moderate   Timing:  Constant   Progression:  Unchanged Myalgias:    Location:  Generalized   Quality:  Aching   Severity:  Moderate   Onset quality:  Gradual   Timing:  Constant   Progression:  Unchanged Associated symptoms: ear pain and nasal congestion   Associated symptoms: no chills, no decreased appetite, no mental status change, no neck stiffness and no syncope        Past Medical History:  Diagnosis Date  . Depression    no on meds, not doing well  . Headache   . Mental disorder    Bipolar, off meds for about a year  . STD (sexually transmitted disease)    Chlamydia,Trich    There are no problems to display for this patient.   Past Surgical History:  Procedure Laterality Date  . THERAPEUTIC ABORTION       OB History    Gravida  5   Para  1   Term  1   Preterm  0   AB  3   Living  1     SAB  0   TAB  3   Ectopic  0   Multiple  0   Live Births  1           Family History  Problem Relation Age of Onset  . Diabetes Mother   . Diabetes Maternal Grandmother     Social History   Tobacco Use  . Smoking status: Current Every Day  Smoker  . Smokeless tobacco: Never Used  Substance Use Topics  . Alcohol use: Not Currently    Comment: Occas.  . Drug use: Not Currently    Types: Marijuana    Home Medications Prior to Admission medications   Medication Sig Start Date End Date Taking? Authorizing Provider  acetaminophen (TYLENOL) 500 MG tablet Take 1,000 mg by mouth every 6 (six) hours as needed.    [provider]  oxyCODONE-acetaminophen (PERCOCET/ROXICET) 5-325 MG tablet Take 1-2 tablets by mouth every 4 (four) hours as needed for severe pain. 08/21/18   Gerrit Heck, CNM  Prenatal Vit-Fe Fumarate-FA (PRENATAL MULTIVITAMIN) TABS tablet Take 1 tablet by mouth daily at 12 noon.    [provider]    Allergies    Patient has no known allergies.  Review of Systems   Review of Systems  Constitutional: Positive for fatigue. Negative for chills and decreased appetite.  HENT: Positive for congestion, ear pain and rhinorrhea.   Eyes: Negative for visual disturbance.  Respiratory: Positive for cough.   Cardiovascular: Positive for chest  pain.  Gastrointestinal: Positive for abdominal pain, diarrhea and nausea. Negative for vomiting.  Genitourinary: Negative for dysuria.  Musculoskeletal: Positive for myalgias. Negative for neck stiffness.  Skin: Negative for rash.  Neurological: Positive for headaches.    Physical Exam Updated Vital Signs BP (!) 155/83 (BP Location: Left Arm)   Pulse (!) 126   Temp 98.8 F (37.1 C) (Oral)   Resp 18   LMP 12/15/2018   SpO2 99%   Physical Exam Vitals and nursing note reviewed.  Constitutional:      General: She is not in acute distress.    Appearance: She is well-developed.  HENT:     Head: Normocephalic and atraumatic.  Eyes:     Conjunctiva/sclera: Conjunctivae normal.  Cardiovascular:     Rate and Rhythm: Normal rate and regular rhythm.     Heart sounds: No murmur.  Pulmonary:     Effort: Pulmonary effort is normal. No respiratory distress.      Breath sounds: Normal breath sounds.  Abdominal:     Palpations: Abdomen is soft.     Tenderness: There is no abdominal tenderness. There is no guarding or rebound.  Musculoskeletal:        General: No deformity or signs of injury. Normal range of motion.     Cervical back: Neck supple.  Skin:    General: Skin is warm and dry.     Capillary Refill: Capillary refill takes less than 2 seconds.  Neurological:     General: No focal deficit present.     Mental Status: She is alert.     ED Results / Procedures / Treatments   Labs (all labs ordered are listed, but only abnormal results are displayed) Labs Reviewed  PREGNANCY, URINE - Abnormal; Notable for the following components:      Result Value   Preg Test, Ur POSITIVE (*)    All other components within normal limits  POC SARS CORONAVIRUS 2 AG -  ED - Abnormal; Notable for the following components:   SARS Coronavirus 2 Ag POSITIVE (*)    All other components within normal limits    EKG EKG Interpretation  Date/Time:  Friday February 01 2019 20:00:05 EST Ventricular Rate:  93 PR Interval:    QRS Duration: 83 QT Interval:  349 QTC Calculation: 435 R Axis:   88 Text Interpretation: Sinus rhythm Consider right atrial enlargement ST elev, probable normal early repol pattern new twi 3 otherwise similar to prior 7/20 Confirmed by Aletta Edouard [phone]) on 02/01/2019 8:08:12 PM   Radiology DG Chest Port 1 View  Result Date: 02/01/2019 CLINICAL DATA:  Cough and fatigue for 1 week EXAM: PORTABLE CHEST 1 VIEW COMPARISON:  08/02/2018 FINDINGS: The heart size and mediastinal contours are within normal limits. Both lungs are clear. The visualized skeletal structures are unremarkable. IMPRESSION: No active disease. Electronically Signed   By: Inez Catalina M.D.   On: 02/01/2019 20:19    Procedures Procedures (including critical care time)  Medications Ordered in ED Medications - No data to display  ED Course  I have reviewed the  triage vital signs and the nursing notes.  Pertinent labs & imaging results that were available during my care of the patient were reviewed by me and considered in my medical decision making (see chart for details).  Clinical Course as of Feb 01 1025  Fri Feb 01, 2019  19520 Healthy 29 year old female here with fatigue body aches cough nonproductive earache chest pain abdominal pain.  Initially tachycardic  on arrival but now under 100.  Satting 100% on room air.  Afebrile.  Differential includes Covid, viral syndrome, pneumonia.  Will check point-of-care Covid testing and if negative consider more involved work-up.   [MB]  2059 Both patient's Covid testing and her pregnancy are positive.  Chest x-ray, she was shielded at the time, did not show any obvious infiltrates.  Satting 100%.  Reviewed the results with patient and she is comfortable with plan for discharge.  She understands to follow-up with her OB and to return if any worsening Covid symptoms.   [MB]    Clinical Course User Index [MB] Terrilee Files, MD   MDM Rules/Calculators/A&P                     Nakea Gouger was evaluated in Emergency Department on 02/01/2019 for the symptoms described in the history of present illness. She was evaluated in the context of the global COVID-19 pandemic, which necessitated consideration that the patient might be at risk for infection with the SARS-CoV-2 virus that causes COVID-19. Institutional protocols and algorithms that pertain to the evaluation of patients at risk for COVID-19 are in a state of rapid change based on information released by regulatory bodies including the CDC and federal and state organizations. These policies and algorithms were followed during the patient's care in the ED.   Final Clinical Impression(s) / ED Diagnoses Final diagnoses:  COVID-19 virus infection  Early stage of pregnancy    Rx / DC Orders ED Discharge Orders    None       Terrilee Files, MD 02/02/19  1028

## 2019-02-01 NOTE — ED Triage Notes (Signed)
Pt reports fatigue since last week. Reports since Monday having body aches, cough, headaches, ear pain and congestion. Had diarrhea earlier this week but non in couple days.

## 2019-02-16 ENCOUNTER — Inpatient Hospital Stay (HOSPITAL_COMMUNITY)
Admission: AD | Admit: 2019-02-16 | Discharge: 2019-02-17 | Disposition: A | Discharge status: Home / Self Care | Payer: Medicaid Other | Attending: Obstetrics & Gynecology | Admitting: Obstetrics & Gynecology

## 2019-02-16 ENCOUNTER — Other Ambulatory Visit: Payer: Self-pay

## 2019-02-16 ENCOUNTER — Encounter (HOSPITAL_COMMUNITY): Payer: Self-pay | Admitting: Obstetrics & Gynecology

## 2019-02-16 DIAGNOSIS — Z3A09 9 weeks gestation of pregnancy: Secondary | ICD-10-CM | POA: Diagnosis not present

## 2019-02-16 DIAGNOSIS — M545 Low back pain: Secondary | ICD-10-CM | POA: Diagnosis not present

## 2019-02-16 DIAGNOSIS — Z3A01 Less than 8 weeks gestation of pregnancy: Secondary | ICD-10-CM

## 2019-02-16 DIAGNOSIS — R109 Unspecified abdominal pain: Secondary | ICD-10-CM | POA: Diagnosis present

## 2019-02-16 DIAGNOSIS — O99331 Smoking (tobacco) complicating pregnancy, first trimester: Secondary | ICD-10-CM | POA: Diagnosis not present

## 2019-02-16 DIAGNOSIS — F17200 Nicotine dependence, unspecified, uncomplicated: Secondary | ICD-10-CM | POA: Diagnosis not present

## 2019-02-16 DIAGNOSIS — O26891 Other specified pregnancy related conditions, first trimester: Secondary | ICD-10-CM | POA: Diagnosis not present

## 2019-02-16 DIAGNOSIS — O3680X0 Pregnancy with inconclusive fetal viability, not applicable or unspecified: Secondary | ICD-10-CM

## 2019-02-16 LAB — POCT PREGNANCY, URINE: Preg Test, Ur: POSITIVE — AB

## 2019-02-16 MED ORDER — CYCLOBENZAPRINE HCL 10 MG PO TABS
10.0000 mg | ORAL_TABLET | Freq: Once | ORAL | Status: AC
  Administered 2019-02-17: 10 mg via ORAL
  Filled 2019-02-16: qty 1

## 2019-02-16 NOTE — MAU Provider Note (Signed)
History     CSN: [id number]  Arrival date and time: 02/16/19 2302   First Provider Initiated Contact with Patient 02/16/19 2342      Chief Complaint  Patient presents with  . Abdominal Pain  . Back Pain  . Possible Pregnancy   Megan Chambers is a 29 y.o. [id number] at [redacted]w[redacted]d.  She presents today for Abdominal Pain, Back Pain. She states she has been experiencing the pain since prior to the pregnancy test and initially thought her period was [place] start.  However, today it started about 6pm lower back pain with cramping around 7 pm.  She states the cramping is constant and located in her lower abdomen and recalls that she experienced the same symptoms with her previous miscarriages.  Patient states she took tylenol 500mg  with no relief of symptoms, which usually helps.  Patient reports she has been doing some house cleaning and ran some errands prior to the onset of pain. She denies sexual activity in the last 3 days, but reports masturbation yesterday with cramping, almost immediately after, that resolved. Patient rates her back pain a 7.5/10 and her abdominal pain a 6/10.  She endorses nausea, but denies vomiting.       OB History    Gravida  6   Para  1   Term  1   Preterm  0   AB  3   Living  1     SAB  0   TAB  3   Ectopic  0   Multiple  0   Live Births  1           Past Medical History:  Diagnosis Date  . Depression    no on meds, not doing well  . Headache   . Mental disorder    Bipolar, off meds for about a year  . STD (sexually transmitted disease)    Chlamydia,Trich    Past Surgical History:  Procedure Laterality Date  . THERAPEUTIC ABORTION      Family History  Problem Relation Age of Onset  . Diabetes Mother   . Diabetes Maternal Grandmother     Social History   Tobacco Use  . Smoking status: Current Every Day Smoker  . Smokeless tobacco: Never Used  Substance Use Topics  . Alcohol use: Not Currently    Comment: Occas.  . Drug use:  Not Currently    Types: Marijuana    Allergies: No Known Allergies  Medications Prior to Admission  Medication Sig Dispense Refill Last Dose  . acetaminophen (TYLENOL) 500 MG tablet Take 1,000 mg by mouth every 6 (six) hours as needed.     07-05-1980 oxyCODONE-acetaminophen (PERCOCET/ROXICET) 5-325 MG tablet Take 1-2 tablets by mouth every 4 (four) hours as needed for severe pain. 6 tablet 0   . Prenatal Vit-Fe Fumarate-FA (PRENATAL MULTIVITAMIN) TABS tablet Take 1 tablet by mouth daily at 12 noon.       Review of Systems  Constitutional: Negative for chills and fever.  Respiratory: Positive for shortness of breath. Negative for cough.   Gastrointestinal: Positive for abdominal pain, constipation and nausea. Negative for diarrhea ("2-3 days ago and it was small and hard.") and vomiting.  Genitourinary: Negative for difficulty urinating, dysuria, vaginal bleeding and vaginal discharge.  Musculoskeletal: Positive for back pain.  Neurological: Negative for dizziness, light-headedness and headaches.   Physical Exam   Blood pressure 137/81, pulse (!) 106, temperature 98.3 F (36.8 C), temperature source Oral, resp. rate 17, last menstrual period  12/15/2018, SpO2 100 %, unknown if currently breastfeeding.  Physical Exam  Constitutional: She is oriented to person, place, and time. She appears well-developed and well-nourished.  HENT:  Head: Normocephalic and atraumatic.  Eyes: Conjunctivae are normal.  Cardiovascular: Normal rate, regular rhythm and normal heart sounds.  Respiratory: Effort normal and breath sounds normal.  GI: Soft. Bowel sounds are normal. There is abdominal tenderness.  Genitourinary: Uterus is enlarged. Uterus is not tender. Cervix exhibits no motion tenderness, no discharge and no friability.    Vaginal discharge present.     No vaginal bleeding.  No bleeding in the vagina.    Genitourinary Comments:  Speculum Exam: -Normal External Genitalia: Non tender, no apparent  discharge at introitus.  -Vaginal Vault: Pink mucosa with good rugae. Moderate amt thin white discharge -wet prep collected -Cervix:Pink, no lesions, cysts, or polyps.  Appears closed. No active bleeding from os-GC/CT collected -Bimanual Exam:  Uterus Enlarged to 6-[redacted]wk ga, Non tender   Musculoskeletal:        General: Normal range of motion.     Cervical back: Normal range of motion.  Neurological: She is alert and oriented to person, place, and time.  Skin: Skin is warm and dry.  Psychiatric: She has a normal mood and affect. Her behavior is normal.    MAU Course  Procedures Results for orders placed or performed during the hospital encounter of 02/16/19 (from the past 24 hour(s))  Pregnancy, urine POC     Status: Abnormal   Collection Time: 02/16/19 11:22 PM  Result Value Ref Range   Preg Test, Ur POSITIVE (A) NEGATIVE  Urinalysis, Routine w reflex microscopic     Status: Abnormal   Collection Time: 02/16/19 11:23 PM  Result Value Ref Range   Color, Urine YELLOW YELLOW   APPearance CLEAR CLEAR   Specific Gravity, Urine 1.011 1.005 - 1.030   pH 6.0 5.0 - 8.0   Glucose, UA NEGATIVE NEGATIVE mg/dL   Hgb urine dipstick SMALL (A) NEGATIVE   Bilirubin Urine NEGATIVE NEGATIVE   Ketones, ur NEGATIVE NEGATIVE mg/dL   Protein, ur NEGATIVE NEGATIVE mg/dL   Nitrite NEGATIVE NEGATIVE   Leukocytes,Ua NEGATIVE NEGATIVE   RBC / HPF 0-5 0 - 5 RBC/hpf   WBC, UA 0-5 0 - 5 WBC/hpf   Bacteria, UA RARE (A) NONE SEEN   Squamous Epithelial / LPF 0-5 0 - 5   Mucus PRESENT   Wet prep, genital     Status: Abnormal   Collection Time: 02/17/19 12:01 AM  Result Value Ref Range   Yeast Wet Prep HPF POC NONE SEEN NONE SEEN   Trich, Wet Prep NONE SEEN NONE SEEN   Clue Cells Wet Prep HPF POC NONE SEEN NONE SEEN   WBC, Wet Prep HPF POC MANY (A) NONE SEEN   Sperm NONE SEEN   CBC     Status: None   Collection Time: 02/17/19 12:05 AM  Result Value Ref Range   WBC 9.5 4.0 - 10.5 K/uL   RBC 4.35 3.87 -  5.11 MIL/uL   Hemoglobin 12.2 12.0 - 15.0 g/dL   HCT 60.7 37.1 - 06.2 %   MCV 85.1 80.0 - 100.0 fL   MCH 28.0 26.0 - 34.0 pg   MCHC 33.0 30.0 - 36.0 g/dL   RDW 69.4 85.4 - 62.7 %   Platelets 233 150 - 400 K/uL   nRBC 0.0 0.0 - 0.2 %  hCG, quantitative, pregnancy     Status: Abnormal   Collection Time:  02/17/19 12:05 AM  Result Value Ref Range   hCG, Beta Chain, Quant, S 87,101 (H) <5 mIU/mL   US OB Comp Less 14 Wks  Result Date: 02/17/2019 CLINICAL DATA:  Pregnant patient with pregnancy of unknown anatomic location. Gestational age based on last menstrual period of 9 weeks 1 day. Abdominal and back pain. EXAM: OBSTETRIC <14 WK ULTRASOUND TECHNIQUE: Transabdominal ultrasound was performed for evaluation of the gestation as well as the maternal uterus and adnexal regions. COMPARISON:  None this pregnancy.  Obstetric ultrasound 08/21/2018. FINDINGS: Intrauterine gestational sac: Single Yolk sac:  Visualized. Embryo:  Visualized. Cardiac Activity: Visualized. Heart Rate: 162 bpm CRL:   14.3 mm   7 w 5 d                  [place] EDC: 10/01/2019 Subchorionic hemorrhage:  None visualized. Maternal uterus/adnexae: Both ovaries are visualized and are normal. No pelvic free fluid. No adnexal mass. IMPRESSION: Single live intrauterine pregnancy estimated gestational age [redacted] weeks 5 days based on crown-rump length for ultrasound [facility] 10/01/2019. No subchorionic hemorrhage. Electronically Signed   By: Keith Rake M.D.   On: 02/17/2019 00:39    MDM Pelvic Exam; Wet Prep and GC/CT Labs: UA, UPT, CBC, hCG Ultrasound Pain Medication Assessment and Plan  29 year old  [id number] at 9.0 weeks Abdominal Pain Back Pain  -Reviewed POC with patient. -Exam performed and findings discussed.  -Cultures collected and pending. -Patient offered and accepts pain medication. -Will give flexeril 10mg  -Labs ordered. -Will send to [place] and await results.  Maryann Conners, MSN, CNM 02/16/2019, 11:42 PM   Reassessment  (1:13 AM) SIUP at 7.5 weeks  -[place] and lab results return as above. -Patient informed of findings and expresses relief with IUP. -Discussed change in EDD based on [place] today. -Patient reports no current pain s/t flexeril dosing.  -Patient questions safety of tylenol 1g at home.  Informed safe to take 1g, but not to exceed 4 doses in 24 hours. -Patient instructed to start [facility] and plans to go to [facility] area provider. -Encouraged to call or return to MAU if symptoms worsen or with the onset of new symptoms. -Discharged to home in improved condition.  Maryann Conners MSN, CNM Advanced Practice Provider, [facility] for [facility]

## 2019-02-16 NOTE — MAU Note (Signed)
Patient reports to MAU c/o back pain and abdominal pain that has been ongoing. Pt denies bleeding and discharge.

## 2019-02-17 ENCOUNTER — Inpatient Hospital Stay (HOSPITAL_COMMUNITY): Payer: Medicaid Other

## 2019-02-17 DIAGNOSIS — Z3A01 Less than 8 weeks gestation of pregnancy: Secondary | ICD-10-CM

## 2019-02-17 DIAGNOSIS — O26891 Other specified pregnancy related conditions, first trimester: Secondary | ICD-10-CM

## 2019-02-17 DIAGNOSIS — R109 Unspecified abdominal pain: Secondary | ICD-10-CM

## 2019-02-17 LAB — URINALYSIS, ROUTINE W REFLEX MICROSCOPIC
Bilirubin Urine: NEGATIVE
Glucose, UA: NEGATIVE mg/dL
Ketones, ur: NEGATIVE mg/dL
Leukocytes,Ua: NEGATIVE
Nitrite: NEGATIVE
Protein, ur: NEGATIVE mg/dL
Specific Gravity, Urine: 1.011 (ref 1.005–1.030)
pH: 6 (ref 5.0–8.0)

## 2019-02-17 LAB — CBC
HCT: 37 % (ref 36.0–46.0)
Hemoglobin: 12.2 g/dL (ref 12.0–15.0)
MCH: 28 pg (ref 26.0–34.0)
MCHC: 33 g/dL (ref 30.0–36.0)
MCV: 85.1 fL (ref 80.0–100.0)
Platelets: 233 10*3/uL (ref 150–400)
RBC: 4.35 MIL/uL (ref 3.87–5.11)
RDW: 13.7 % (ref 11.5–15.5)
WBC: 9.5 10*3/uL (ref 4.0–10.5)
nRBC: 0 % (ref 0.0–0.2)

## 2019-02-17 LAB — WET PREP, GENITAL
Clue Cells Wet Prep HPF POC: NONE SEEN
Sperm: NONE SEEN
Trich, Wet Prep: NONE SEEN
Yeast Wet Prep HPF POC: NONE SEEN

## 2019-02-17 LAB — HCG, QUANTITATIVE, PREGNANCY: hCG, Beta Chain, Quant, S: 87101 m[IU]/mL — ABNORMAL HIGH (ref <5)

## 2019-02-17 NOTE — Discharge Instructions (Signed)
Abdominal Pain During Pregnancy  Abdominal pain is common during pregnancy, and has many possible causes. Some causes are more serious than others, and sometimes the cause is not known. Abdominal pain can be a sign that labor is starting. It can also be caused by normal growth and stretching of muscles and ligaments during pregnancy. Always tell your health care provider if you have any abdominal pain. Follow these instructions at home:  Do not have sex or put anything in your vagina until your pain goes away completely.  Get plenty of rest until your pain improves.  Drink enough fluid to keep your urine pale yellow.  Take over-the-counter and prescription medicines only as told by your health care provider.  Keep all follow-up visits as told by your health care provider. This is important. Contact a health care provider if:  Your pain continues or gets worse after resting.  You have lower abdominal pain that: ? Comes and goes at regular intervals. ? Spreads to your back. ? Is similar to menstrual cramps.  You have pain or burning when you urinate. Get help right away if:  You have a fever or chills.  You have vaginal bleeding.  You are leaking fluid from your vagina.  You are passing tissue from your vagina.  You have vomiting or diarrhea that lasts for more than 24 hours.  Your baby is moving less than usual.  You feel very weak or faint.  You have shortness of breath.  You develop severe pain in your upper abdomen. Summary  Abdominal pain is common during pregnancy, and has many possible causes.  If you experience abdominal pain during pregnancy, tell your health care provider right away.  Follow your health care provider's home care instructions and keep all follow-up visits as directed. This information is not intended to replace advice given to you by your health care provider. Make sure you discuss any questions you have with your health care  provider. Document Revised: 04/23/2018 Document Reviewed: 04/07/2016 Elsevier Patient Education  2020 [employer]  

## 2019-02-18 LAB — GC/CHLAMYDIA PROBE AMP (~~LOC~~) NOT AT ARMC
Chlamydia: NEGATIVE
Comment: NEGATIVE
Comment: NORMAL
Neisseria Gonorrhea: NEGATIVE

## 2019-02-19 ENCOUNTER — Ambulatory Visit: Payer: Medicaid Other | Attending: Internal Medicine

## 2019-02-19 DIAGNOSIS — Z20822 Contact with and (suspected) exposure to covid-19: Secondary | ICD-10-CM

## 2019-02-20 LAB — NOVEL CORONAVIRUS, NAA: SARS-CoV-2, NAA: NOT DETECTED

## 2019-04-18 ENCOUNTER — Encounter: Payer: Medicaid Other | Admitting: Obstetrics and Gynecology

## 2019-07-10 ENCOUNTER — Encounter

## 2019-07-11 ENCOUNTER — Inpatient Hospital Stay: Admit: 2019-07-11 | Payer: PRIVATE HEALTH INSURANCE | Attending: Obstetrics & Gynecology

## 2019-07-11 DIAGNOSIS — O210 Mild hyperemesis gravidarum: Secondary | ICD-10-CM

## 2019-08-06 LAB — HIV-1 AB, EXTERNAL
HIV, EXTERNAL: NEGATIVE
HIV, External: NEGATIVE

## 2019-08-06 LAB — HEPATITIS B SURFACE AG, EXTERNAL
HBSAG, EXTERNAL: NEGATIVE
HBsAg, External: NEGATIVE

## 2019-08-06 LAB — N GONORRHOEAE, DNA PROBE, EXTERNAL
Gonorrhea, External: NEGATIVE
N. GONORRHEA, EXTERNAL: NEGATIVE

## 2019-08-06 LAB — CHLAMYDIA DNA PROBE, EXTERNAL
CHLAMYDIA, EXTERNAL: NEGATIVE
Chlamydia, External: NEGATIVE

## 2019-08-06 LAB — RPR, EXTERNAL
RPR, EXTERNAL: NEGATIVE
RPR, External: NEGATIVE

## 2019-08-06 LAB — RUBELLA AB, IGG, EXTERNAL
RUBELLA, EXTERNAL: IMMUNE
Rubella, External: IMMUNE

## 2019-08-21 LAB — GYN RAPID GP B STREP, EXTERNAL
GRBS, EXTERNAL: NEGATIVE
GrBStrep, External: NEGATIVE

## 2019-09-16 ENCOUNTER — Inpatient Hospital Stay
Admit: 2019-09-16 | Discharge: 2019-09-16 | Disposition: A | Discharge status: Home / Self Care | Payer: PRIVATE HEALTH INSURANCE

## 2019-09-16 DIAGNOSIS — O471 False labor at or after 37 completed weeks of gestation: Secondary | ICD-10-CM

## 2019-09-16 LAB — DRUG SCREEN, URINE
Amphetamine: NEGATIVE
Amphetamines: NEGATIVE
Barbiturates: NEGATIVE
Barbiturates: NEGATIVE
Benzodiazapines: NEGATIVE
Benzodiazepines: NEGATIVE
Cocaine: NEGATIVE
Cocaine: NEGATIVE
Marijuana: NEGATIVE
Marijuana: NEGATIVE
Methadone: NEGATIVE
Methadone: NEGATIVE
Opiates: NEGATIVE
Opiates: NEGATIVE
Phencyclidine: NEGATIVE
Phencyclidine: NEGATIVE

## 2019-09-16 NOTE — ED Provider Notes (Signed)
[facility]  Obstetrics Emergency Department Treatment Note        Patient: Julie Hayes Age: 29 y.o. Sex: female    Date of Birth: 02/17/1990 Admit Date: 09/16/2019 PCP: None   MRN: [id number]  CSN: [id number]     Room: 3115/3115 Time Dictated: 5:35 AM        Chief Complaint   Contractions     History of Present Illness   29 y.o. G5P1031 at 37+6 presents to OBED c/o regular, painful uterine contractions q 5 min that started around 1 this morning but have become stronger and rated 5-6/10.  Denies LOF/VB.  +FM.  Denies HA/vision changes/RUQ pain.  Denies cough/fever/SOB.    Primary OB:  Dr Mayford Knife    [facility]:  Prenatal records currently unavailable for review.  Maternal obesity.    PNL:  GBS neg and GC/Chlam neg/neg, no other prenatal records available at this time    POBHX:  SVD x 1, EAB x 3    Review of Systems   Review of Systems   Constitutional: Negative for chills and fever.   HENT: Negative for congestion, facial swelling and sore throat.    Eyes: Negative for visual disturbance.   Respiratory: Negative for cough, shortness of breath and wheezing.    Cardiovascular: Negative for chest pain, palpitations and leg swelling.   Gastrointestinal: Positive for abdominal pain. Negative for constipation, diarrhea, nausea and vomiting.   Endocrine: Negative.    Genitourinary: Negative for dysuria, hematuria, vaginal bleeding and vaginal discharge.   Musculoskeletal: Positive for back pain.   Skin: Negative.    Allergic/Immunologic: Negative for immunocompromised state.   Neurological: Negative for light-headedness and headaches.   Hematological: Negative.    Psychiatric/Behavioral: Negative.         Past Medical/Surgical History     Past Medical History:   Diagnosis Date   ??? Anemia    ??? Depression with anxiety    ??? Postpartum hemorrhage     retained placenta with first baby, no blood products received     History reviewed. No pertinent surgical history.    Social History     Social History      Socioeconomic History   ??? Marital status: SINGLE     Spouse name: NA   ??? Number of children: 1   ??? Years of education: Not on file   ??? Highest education level: Not on file   Occupational History   ??? Not on file   Tobacco Use   ??? Smoking status: Former   Substance and Sexual Activity   ??? Alcohol use: Not currently   ??? Drug use: Not currently   ??? Sexual activity: Not on file   Other Topics Concern   ??? Not on file   Social History Narrative   ??? Not on file       Family History     Reviewed and not found to be pertinent to this episode of care    Current Medications     Prior to Admission Medications   Prescriptions Last Dose Informant Patient Reported? Taking?   methocarbamol (ROBAXIN) 500 mg tablet   Yes No   Sig: Take 500 mg by mouth four (4) times daily as needed.      Facility-Administered Medications: None       Allergies   No Known Allergies    Physical Exam     Patient Vitals for the past 12 hrs:   Temp Pulse BP SpO2  09/16/19 0556 ??? ??? ??? 99 %   09/16/19 0551 ??? ??? ??? 98 %   09/16/19 0547 98.4 ??F (36.9 ??C) 87 119/71 ???   09/16/19 0546 ??? ??? ??? 99 %       Physical Exam  Vitals and nursing note reviewed. Exam conducted with a chaperone present.   Constitutional:       General: She is not in acute distress.     Appearance: She is obese. She is not ill-appearing.   HENT:      Nose: Nose normal.      Mouth/Throat:      Mouth: Mucous membranes are moist.   Eyes:      General: No scleral icterus.     Conjunctiva/sclera: Conjunctivae normal.   Cardiovascular:      Rate and Rhythm: Normal rate and regular rhythm.      Heart sounds: Normal heart sounds. No murmur heard.     Pulmonary:      Effort: Pulmonary effort is normal. No respiratory distress.      Breath sounds: Normal breath sounds. No wheezing.   Abdominal:      General: There is no distension.      Palpations: Abdomen is soft.      Tenderness: There is no abdominal tenderness. There is no guarding.   Genitourinary:     General: Normal vulva.      Vagina: No vaginal  discharge.      Rectum: Normal.   Musculoskeletal:         General: No swelling.   Skin:     General: Skin is warm and dry.   Neurological:      Mental Status: She is alert and oriented to person, place, and time.   Psychiatric:         Mood and Affect: Mood normal.         Behavior: Behavior normal.         Thought Content: Thought content normal.         Judgment: Judgment normal.           OB Exam     Fetal Assessment: 130 baseline, moderate variability, +15x15 accels, no decels, Cat I/reactive  Toco:  q 3-5 min    0550 SVE:  1/long/post/cephalic      Impression and Management Plan     29 yo G5P1 at 37+6 r/o labor.  Reassuring fetal status.  GBS neg.    EFM  Universal UDS       Diagnostic Studies   Lab:   No results found for this or any previous visit (from the past 12 hour(s)).  Labs Reviewed - No data to display    Imaging:    No results found.    ED Course     Patient Vitals for the past 12 hrs:   Temp Pulse BP SpO2   09/16/19 0556 ??? ??? ??? 99 %   09/16/19 0551 ??? ??? ??? 98 %   09/16/19 0547 98.4 ??F (36.9 ??C) 87 119/71 ???   09/16/19 0546 ??? ??? ??? 99 %            Medications - No data to display        Final Diagnosis       ICD-10-CM ICD-9-CM   1. False labor after 37 completed weeks of gestation  O47.1 644.10     29 yo G5P1 at 37+6.  False labor.  Reassuring fetal status.  GBS neg.  Disposition     Discharge to home  F/U tomorrow with Dr Mayford Knife as scheduled or sooner as needed  Labor/ROM/FM precautions  FKC    D/W Dr Lorenso Courier who agrees with assessment and plan           My signature above authenticates this document and my orders, the final    diagnosis(es), discharge prescription(s), and instructions in the Epic    record.  If you have any questions please contact [phone].  Nursing notes have been reviewed by myself.

## 2019-09-16 NOTE — Procedures (Signed)
NST Procedure Note    Patient: Julie Hayes               Sex: female          DOA: 09/16/2019       Date of Birth:  August 19, 1990      Age:  28 y.o.        LOS:  LOS: 0 days     MRN: [id number]                    CSN: [id number]      Estimated Gestational Age:[redacted]w[redacted]d     Indication for NST: labor eval    NST: 15x15    Fetal Vital Signs:  Mode: External  Fetal Heart Rate: 130  Fetal Activity: Present  Variability: Moderate 6-25 bpm  Decelerations:No  Accelerations:Yes  FHR Interpretations:Category I    Non-Stress Test: obgyn fetal nst findings: reactive    Uterine Activity:  Mode: External  Frequency (min): q 3-6 min  Duration (sec): 60       Signed DD:UKGUR Virgia Land, MD  09/16/2019 6:05 AM

## 2019-09-16 NOTE — ED Notes (Signed)
88: Assumed care of the patient at this time, verified name, DOB and allergies.  Pt arrived by wheelchair from the ED with c/o CTXs every 4-5 minutes since 0100, denies LOF, vaginal bleeding and/or sexual intercourse in the last 24-48 hours.  37.6 weeks IUP, pt of Doloris Hall MD (transferred care from the [place] around 24-26 weeks- pt unsure of exact dates.  She reports that her records were sent to Hansel Starling MD from her previous OB).     [id number] Morn MD notified and aware of the patients arrival     0608: Orders received to discharge the patient home- pt to follow up with OB at her scheduled appointment tomorrow 09/17/19    [phone]:  At bedside discussing discharge instructions with the pt, the patient plans to see her primary OB tomorrow in the office for her scheduled appoiontment, labor precautions discussed.   Discharge paperwork signed     [phone]: pt ambulating off the unit for discharge at this time with her mother to drive her home

## 2019-09-25 ENCOUNTER — Encounter

## 2019-09-25 ENCOUNTER — Inpatient Hospital Stay
Admit: 2019-09-25 | Discharge: 2019-09-28 | Disposition: A | Discharge status: Home / Self Care | Payer: PRIVATE HEALTH INSURANCE | Attending: Obstetrics & Gynecology | Admitting: Obstetrics & Gynecology

## 2019-09-25 DIAGNOSIS — O328XX0 Maternal care for other malpresentation of fetus, not applicable or unspecified: Secondary | ICD-10-CM

## 2019-09-25 LAB — CBC WITH AUTOMATED DIFF
BASOPHILS: 0.2 % (ref 0–3)
EOSINOPHILS: 0.2 % (ref 0–5)
HCT: 32.5 % — ABNORMAL LOW (ref 37.0–50.0)
HGB: 10.5 gm/dl — ABNORMAL LOW (ref 13.0–17.2)
IMMATURE GRANULOCYTES: 0.6 % (ref 0.0–3.0)
LYMPHOCYTES: 17.8 % — ABNORMAL LOW (ref 28–48)
MCH: 26.9 pg (ref 25.4–34.6)
MCHC: 32.3 gm/dl (ref 30.0–36.0)
MCV: 83.3 fL (ref 80.0–98.0)
MONOCYTES: 9.2 % (ref 1–13)
MPV: 11.3 fL — ABNORMAL HIGH (ref 6.0–10.0)
NEUTROPHILS: 72 % — ABNORMAL HIGH (ref 34–64)
NRBC: 0 (ref 0–0)
PLATELET: 192 10*3/uL (ref 140–450)
RBC: 3.9 M/uL (ref 3.60–5.20)
RDW-SD: 43.8 (ref 36.4–46.3)
WBC: 8.2 10*3/uL (ref 4.0–11.0)

## 2019-09-25 LAB — DRUG SCREEN, URINE
Amphetamine: NEGATIVE
Amphetamines: NEGATIVE
Barbiturates: NEGATIVE
Barbiturates: NEGATIVE
Benzodiazapines: NEGATIVE
Benzodiazepines: NEGATIVE
Cocaine: NEGATIVE
Cocaine: NEGATIVE
Marijuana: POSITIVE — AB
Marijuana: POSITIVE — AB
Methadone: NEGATIVE
Methadone: NEGATIVE
Opiates: NEGATIVE
Opiates: NEGATIVE
Phencyclidine: NEGATIVE
Phencyclidine: NEGATIVE

## 2019-09-25 LAB — ANTIBODY SCREEN
Antibody Screen: NEGATIVE
Antibody screen: NEGATIVE

## 2019-09-25 LAB — BLOOD TYPE, (ABO+RH)
ABO/Rh(D): B POS
ABO/Rh: B POS

## 2019-09-25 LAB — CBC WITH AUTO DIFFERENTIAL
Basophils %: 0.2 % (ref 0–3)
Eosinophils %: 0.2 % (ref 0–5)
Hematocrit: 32.5 % — ABNORMAL LOW (ref 37.0–50.0)
Hemoglobin: 10.5 gm/dl — ABNORMAL LOW (ref 13.0–17.2)
Immature Granulocytes: 0.6 % (ref 0.0–3.0)
Lymphocytes %: 17.8 % — ABNORMAL LOW (ref 28–48)
MCH: 26.9 pg (ref 25.4–34.6)
MCHC: 32.3 gm/dl (ref 30.0–36.0)
MCV: 83.3 fL (ref 80.0–98.0)
MPV: 11.3 fL — ABNORMAL HIGH (ref 6.0–10.0)
Monocytes %: 9.2 % (ref 1–13)
Neutrophils %: 72 % — ABNORMAL HIGH (ref 34–64)
Nucleated RBCs: 0 (ref 0–0)
Platelets: 192 10*3/uL (ref 140–450)
RBC: 3.9 M/uL (ref 3.60–5.20)
RDW-SD: 43.8 (ref 36.4–46.3)
WBC: 8.2 10*3/uL (ref 4.0–11.0)

## 2019-09-25 MED ORDER — LIDOCAINE HCL 2 % (20 MG/ML) IJ SOLN
20 mg/mL (2 %) | INTRAMUSCULAR | Status: DC | PRN
Start: 2019-09-25 — End: 2019-09-26

## 2019-09-25 MED ORDER — METHYLERGONOVINE MALEATE 0.2 MG/ML IJ SOLN
0.2 mg/mL (1 mL) | INTRAMUSCULAR | Status: DC | PRN
Start: 2019-09-25 — End: 2019-09-26

## 2019-09-25 MED ORDER — DINOPROSTONE SR 10 MG VAGINAL INSERTS
10 mg | Freq: Once | VAGINAL | Status: AC
Start: 2019-09-25 — End: 2019-09-25
  Administered 2019-09-25: 14:00:00 via VAGINAL

## 2019-09-25 MED ORDER — NALOXONE 0.4 MG/ML INJECTION
0.4 mg/mL | INTRAMUSCULAR | Status: DC | PRN
Start: 2019-09-25 — End: 2019-09-26

## 2019-09-25 MED ORDER — CARBOPROST TROMETHAMINE 250 MCG/ML IM SOLN
250 mcg/mL | Freq: Once | INTRAMUSCULAR | Status: AC | PRN
Start: 2019-09-25 — End: 2019-09-26

## 2019-09-25 MED ORDER — MISOPROSTOL 200 MCG TAB
200 mcg | Freq: Once | ORAL | Status: AC | PRN
Start: 2019-09-25 — End: 2019-09-26

## 2019-09-25 MED ORDER — MINERAL OIL ORAL
Freq: Once | ORAL | Status: AC | PRN
Start: 2019-09-25 — End: 2019-09-26

## 2019-09-25 MED ORDER — SODIUM CHLORIDE 0.9 % IJ SYRG
INTRAMUSCULAR | Status: DC | PRN
Start: 2019-09-25 — End: 2019-09-26
  Administered 2019-09-25: 20:00:00 via INTRAVENOUS

## 2019-09-25 MED ORDER — ONDANSETRON (PF) 4 MG/2 ML INJECTION
4 mg/2 mL | INTRAMUSCULAR | Status: DC | PRN
Start: 2019-09-25 — End: 2019-09-26

## 2019-09-25 MED ORDER — LACTATED RINGERS IV
INTRAVENOUS | Status: DC
Start: 2019-09-25 — End: 2019-09-26
  Administered 2019-09-25 (×2): via INTRAVENOUS

## 2019-09-25 MED ORDER — SODIUM CHLORIDE 0.9 % IV PIGGY BACK
1000 mg/10 mL (100 mg/mL) | Freq: Once | INTRAVENOUS | Status: AC | PRN
Start: 2019-09-25 — End: 2019-09-26

## 2019-09-25 MED ORDER — LACTATED RINGERS BOLUS IV
INTRAVENOUS | Status: DC | PRN
Start: 2019-09-25 — End: 2019-09-26

## 2019-09-25 MED ORDER — OXYTOCIN 20 UNITS/1000 ML IN NS
20 unit/1000 mL | Freq: Once | INTRAVENOUS | Status: AC | PRN
Start: 2019-09-25 — End: 2019-09-26

## 2019-09-25 MED ORDER — OXYTOCIN 10 UNIT/ML INJECTION
10 unit/mL | Freq: Once | INTRAMUSCULAR | Status: AC | PRN
Start: 2019-09-25 — End: 2019-09-26

## 2019-09-25 MED ORDER — NALBUPHINE 10 MG/ML INJECTION
10 mg/mL | INTRAMUSCULAR | Status: AC | PRN
Start: 2019-09-25 — End: 2019-09-25
  Administered 2019-09-25 – 2019-09-26 (×2): via INTRAVENOUS

## 2019-09-25 MED FILL — LACTATED RINGERS IV: INTRAVENOUS | Qty: 500

## 2019-09-25 MED FILL — NALBUPHINE 10 MG/ML INJECTION: 10 mg/mL | INTRAMUSCULAR | Qty: 1

## 2019-09-25 MED FILL — LACTATED RINGERS IV: INTRAVENOUS | Qty: 1000

## 2019-09-25 MED FILL — SODIUM CHLORIDE 0.9 % IJ SYRG: INTRAMUSCULAR | Qty: 10

## 2019-09-25 MED FILL — CERVIDIL 10 MG VAGINAL INSERT,CONTROLLED RELEASE: 10 mg | VAGINAL | Qty: 1

## 2019-09-25 NOTE — Progress Notes (Signed)
Report given to RN JPowell

## 2019-09-25 NOTE — Progress Notes (Signed)
0730- pt ambulated to LR3. Fetal monitor/ toco applied. Pulse ox applied  sve done/ 1/ thic/high.    8315- dr. Mayford Knife called. Aware of pt arrival. Explained to him vaginal exam. Dr. Mayford Knife ordered cervidil. Agrees to clear liquids. UDS ordered due to recreation drug use for nursery.  Orders standard labor orders.     Will continue to monitor.

## 2019-09-25 NOTE — Progress Notes (Signed)
1500- called dr. Mayford Knife w. Current SVE. Pt requested pain medication. Nubain 10mg  IV given per dr. . Will continue to monitor.

## 2019-09-26 MED ORDER — LIDOCAINE-EPINEPHRINE 1.5 %-1:200,000 IJ SOLN
1.5 %-1:200,000 | INTRAMUSCULAR | Status: DC | PRN
Start: 2019-09-26 — End: 2019-09-26
  Administered 2019-09-26 (×2): via EPIDURAL

## 2019-09-26 MED ORDER — OXYCODONE-ACETAMINOPHEN 5 MG-325 MG TAB
5-325 mg | Freq: Four times a day (QID) | ORAL | Status: DC | PRN
Start: 2019-09-26 — End: 2019-09-28
  Administered 2019-09-27 – 2019-09-28 (×2): via ORAL

## 2019-09-26 MED ORDER — ALUM-MAG HYDROXIDE-SIMETH 200 MG-200 MG-20 MG/5 ML ORAL SUSP
200-200-20 mg/5 mL | ORAL | Status: DC | PRN
Start: 2019-09-26 — End: 2019-09-28

## 2019-09-26 MED ORDER — OXYTOCIN 20 UNITS/1000 ML IN NS
20 unit/1000 mL | INTRAVENOUS | Status: AC
Start: 2019-09-26 — End: 2019-09-26
  Administered 2019-09-26: 11:00:00 via INTRAVENOUS

## 2019-09-26 MED ORDER — SIMETHICONE 80 MG CHEWABLE TAB
80 mg | Freq: Three times a day (TID) | ORAL | Status: DC | PRN
Start: 2019-09-26 — End: 2019-09-28

## 2019-09-26 MED ORDER — SENNOSIDES 8.6 MG TAB
8.6 mg | Freq: Every evening | ORAL | Status: DC
Start: 2019-09-26 — End: 2019-09-28
  Administered 2019-09-28: 02:00:00 via ORAL

## 2019-09-26 MED ORDER — LACTATED RINGERS BOLUS IV
Freq: Once | INTRAVENOUS | Status: AC
Start: 2019-09-26 — End: 2019-09-26
  Administered 2019-09-26 (×2): via INTRAVENOUS

## 2019-09-26 MED ORDER — TRANEXAMIC ACID 1,000 MG/10 ML (100 MG/ML) IV
1000 mg/10 mL (100 mg/mL) | Freq: Once | INTRAVENOUS | Status: AC | PRN
Start: 2019-09-26 — End: 2019-09-27

## 2019-09-26 MED ORDER — OXYTOCIN 20 UNITS/1000 ML IN NS
20 unit/1000 mL | INTRAVENOUS | Status: DC
Start: 2019-09-26 — End: 2019-09-28
  Administered 2019-09-26 (×8): via INTRAVENOUS

## 2019-09-26 MED ORDER — NALOXONE 0.4 MG/ML INJECTION
0.4 mg/mL | Freq: Once | INTRAMUSCULAR | Status: DC | PRN
Start: 2019-09-26 — End: 2019-09-26

## 2019-09-26 MED ORDER — SODIUM CHLORIDE 0.9 % IJ SYRG
INTRAMUSCULAR | Status: DC | PRN
Start: 2019-09-26 — End: 2019-09-28

## 2019-09-26 MED ORDER — HYDROCORTISONE 2.5 % RECTAL CREAM
2.5 % | CUTANEOUS | Status: DC | PRN
Start: 2019-09-26 — End: 2019-09-28

## 2019-09-26 MED ORDER — DIPHENHYDRAMINE 25 MG CAP
25 mg | Freq: Four times a day (QID) | ORAL | Status: DC | PRN
Start: 2019-09-26 — End: 2019-09-28

## 2019-09-26 MED ORDER — GLYCERIN-WITCH HAZEL 12.5 %-50 % TOPICAL PADS
CUTANEOUS | Status: DC | PRN
Start: 2019-09-26 — End: 2019-09-28

## 2019-09-26 MED ORDER — ZOLPIDEM 5 MG TAB
5 mg | Freq: Every evening | ORAL | Status: DC | PRN
Start: 2019-09-26 — End: 2019-09-28

## 2019-09-26 MED ORDER — BENZOCAINE 20 % TOPICAL AEROSOL
20 % | CUTANEOUS | Status: DC | PRN
Start: 2019-09-26 — End: 2019-09-28

## 2019-09-26 MED ORDER — FENTANYL-BUPIVACAINE IN NS (PF) 2 MCG/ML-0.1 % EPIDURAL
INTRAMUSCULAR | Status: DC
Start: 2019-09-26 — End: 2019-09-26
  Administered 2019-09-26 (×2): via EPIDURAL

## 2019-09-26 MED ORDER — IBUPROFEN 600 MG TAB
600 mg | Freq: Four times a day (QID) | ORAL | Status: DC | PRN
Start: 2019-09-26 — End: 2019-09-28
  Administered 2019-09-27 – 2019-09-28 (×5): via ORAL

## 2019-09-26 MED ORDER — ONDANSETRON (PF) 4 MG/2 ML INJECTION
4 mg/2 mL | Freq: Four times a day (QID) | INTRAMUSCULAR | Status: DC | PRN
Start: 2019-09-26 — End: 2019-09-28

## 2019-09-26 MED FILL — NALBUPHINE 10 MG/ML INJECTION: 10 mg/mL | INTRAMUSCULAR | Qty: 1

## 2019-09-26 MED FILL — LACTATED RINGERS IV: INTRAVENOUS | Qty: 500

## 2019-09-26 MED FILL — OXYTOCIN 20 UNITS/1000 ML IN NS: 20 unit/1000 mL | INTRAVENOUS | Qty: 1000

## 2019-09-26 MED FILL — FENTANYL-BUPIVACAINE IN NS (PF) 2 MCG/ML-0.1 % EPIDURAL: INTRAMUSCULAR | Qty: 100

## 2019-09-26 NOTE — Anesthesia Procedure Notes (Signed)
Epidural Block    Patient location during procedure: OB  Start time: 09/26/2019 3:58 AM  End time: 09/26/2019 4:03 AM  Reason for block: labor epidural  Staffing  Performed: CRNA   Anesthesiologist: Lubertha Basque, DO  Resident/CRNA: Dallie Dad, CRNA  Preanesthetic Checklist  Completed: patient identified, IV checked, risks and benefits discussed, surgical consent, monitors and equipment checked, pre-op evaluation and timeout performed  Block Placement  Patient position: sitting  Prep: Betadine and ChloraPrep  Sterility prep: cap, drape, gloves and mask  Sedation level: no sedation  Patient monitoring: heart rate, frequent blood pressure checks and continuous pulse oximetry  Approach: midline  Location: lumbar  Lumbar location: L3-L4  Epidural  Loss of resistance technique: saline  Guidance: landmark technique  Needle  Needle type: Tuohy   Needle gauge: 18 G  Needle length: 9 cm  Needle insertion depth: 6 cm  Catheter type: multi-orifice  Catheter size: 20 G  Catheter at skin depth: 12 cm  Catheter securement method: clear occlusive dressing, liquid medical adhesive and surgical tape  Test dose: negative  Assessment  Block outcome: pain improved  Number of attempts: 1  Procedure assessment: patient tolerated procedure well with no immediate complications  Additional Notes  Time out 0357.

## 2019-09-26 NOTE — Anesthesia Post-Procedure Evaluation (Signed)
*   No procedures listed *.    epidural    Anesthesia Post Evaluation      Multimodal analgesia: multimodal analgesia used between 6 hours prior to anesthesia start to PACU discharge  Patient location during evaluation: bedside  Patient participation: complete - patient participated  Level of consciousness: awake  Pain management: satisfactory to patient  Airway patency: patent  Anesthetic complications: no  Cardiovascular status: stable  Respiratory status: acceptable  Hydration status: balanced  Post anesthesia nausea and vomiting:  none      INITIAL Post-op Vital signs:   Vitals Value Taken Time   BP 122/67 09/26/19 1615   Temp     Pulse 104 09/26/19 1615   Resp     SpO2 99 % 09/26/19 1615   Vitals shown include unvalidated device data.

## 2019-09-26 NOTE — Anesthesia Pre-Procedure Evaluation (Signed)
Relevant Problems   No relevant active problems       Anesthetic History   No history of anesthetic complications            Review of Systems / Medical History  Patient summary reviewed, nursing notes reviewed and pertinent labs reviewed    Pulmonary  Within defined limits                 Neuro/Psych         Psychiatric history    Comments: Depression and anxiety Cardiovascular  Within defined limits                     GI/Hepatic/Renal  Within defined limits              Endo/Other        Obesity and anemia     Other Findings   Comments: Marijuana use           Physical Exam    Airway  Mallampati: II  TM Distance: 4 - 6 cm  Neck ROM: normal range of motion   Mouth opening: Normal     Cardiovascular  Regular rate and rhythm,  S1 and S2 normal,  no murmur, click, rub, or gallop  Rhythm: regular  Rate: normal         Dental  No notable dental hx       Pulmonary  Breath sounds clear to auscultation               Abdominal  GI exam deferred       Other Findings            Anesthetic Plan    ASA: 2  Anesthesia type: epidural            Anesthetic plan and risks discussed with: Patient

## 2019-09-26 NOTE — Progress Notes (Signed)
Pt up to BR to void, tolerated well, bleeding minimal, peri care done, pt feels as though she has emptied her bladder fully, encouraged to call for assistance anytime

## 2019-09-26 NOTE — Progress Notes (Signed)
Delivery Note    Obstetrician:  Janice Coffin, MD    Pre-Delivery Diagnosis: Term pregnancy    Post-Delivery Diagnosis: Female    Intrapartum Event: None    Procedure: Vacuum assisted delivery    Epidural: YES    Indications for instrumental delivery: none or malrotation    Estimated Blood Loss: pending    Episiotomy: none    Laceration(s):  none    Laceration(s) repair: NO    Fetal Description: singleton    Fetal Position: Occiput Anterior    Birth Weight: 3520 g    Birth Length: 52.5 cm    Apgar - One Minute: 8    Apgar - Five Minutes: 9    Umbilical Cord: 3 vessels present    Specimens: none           Complications:  none          Attending Attestation: I was present and scrubbed for the entire procedure    Signed By:  Janice Coffin, MD     September 26, 2019

## 2019-09-27 LAB — CBC W/O DIFF
HCT: 29.2 % — ABNORMAL LOW (ref 37.0–50.0)
HGB: 9.3 gm/dl — ABNORMAL LOW (ref 13.0–17.2)
MCH: 27.7 pg (ref 25.4–34.6)
MCHC: 31.8 gm/dl (ref 30.0–36.0)
MCV: 86.9 fL (ref 80.0–98.0)
MPV: 11.8 fL — ABNORMAL HIGH (ref 6.0–10.0)
PLATELET: 169 10*3/uL (ref 140–450)
RBC: 3.36 M/uL — ABNORMAL LOW (ref 3.60–5.20)
RDW-SD: 45.8 (ref 36.4–46.3)
WBC: 13.3 10*3/uL — ABNORMAL HIGH (ref 4.0–11.0)

## 2019-09-27 LAB — CBC
Hematocrit: 29.2 % — ABNORMAL LOW (ref 37.0–50.0)
Hemoglobin: 9.3 gm/dl — ABNORMAL LOW (ref 13.0–17.2)
MCH: 27.7 pg (ref 25.4–34.6)
MCHC: 31.8 gm/dl (ref 30.0–36.0)
MCV: 86.9 fL (ref 80.0–98.0)
MPV: 11.8 fL — ABNORMAL HIGH (ref 6.0–10.0)
Platelets: 169 10*3/uL (ref 140–450)
RBC: 3.36 M/uL — ABNORMAL LOW (ref 3.60–5.20)
RDW-SD: 45.8 (ref 36.4–46.3)
WBC: 13.3 10*3/uL — ABNORMAL HIGH (ref 4.0–11.0)

## 2019-09-27 MED ORDER — DIPHTH,PERTUS(AC)TETANUS VAC(PF) 2.5 LF UNIT-8 MCG-5 LF/0.5 ML INJ
INTRAMUSCULAR | Status: AC
Start: 2019-09-27 — End: 2019-09-27
  Administered 2019-09-27: 22:00:00 via INTRAMUSCULAR

## 2019-09-27 MED ORDER — IBUPROFEN 600 MG TAB
600 mg | ORAL_TABLET | Freq: Four times a day (QID) | ORAL | 1 refills | Status: AC | PRN
Start: 2019-09-27 — End: ?

## 2019-09-27 MED FILL — IBUPROFEN 600 MG TAB: 600 mg | ORAL | Qty: 1

## 2019-09-27 MED FILL — OXYCODONE-ACETAMINOPHEN 5 MG-325 MG TAB: 5-325 mg | ORAL | Qty: 1

## 2019-09-27 MED FILL — SENNA LAX 8.6 MG TABLET: 8.6 mg | ORAL | Qty: 2

## 2019-09-27 NOTE — Consults (Signed)
This note was copied from a baby's chart.  S: Initial visit    B: G4P2, vaginal  delivery, GA [redacted]wk.  Mother reports with her last baby attempting breastfeeding.     A:  Mother states that she desires to fomula feed.  She is considering offering her breast because the baby seems interested.  Discussed supply and demand. Mom will continue to formula feed for now.  Encouraged mom to request assistance as desired.  No other questions or concerns at this time.    R:  Initiate feeding record to monitor intake and output  Request lactation assistance as desired.

## 2019-09-27 NOTE — Progress Notes (Signed)
Problem: Vaginal Delivery: Postpartum 2  Goal: Activity/Safety  Outcome: Progressing Towards Goal  Goal: Nutrition/Diet  Outcome: Progressing Towards Goal  Goal: Discharge Planning  Outcome: Progressing Towards Goal  Goal: Medications  Outcome: Progressing Towards Goal  Goal: Treatments/Interventions/Procedures  Outcome: Progressing Towards Goal  Goal: Psychosocial  Outcome: Progressing Towards Goal

## 2019-09-27 NOTE — H&P (Signed)
History & Physical    Name: Julie Hayes MRN: [id number]  SSN: [id number]    Date of Birth: 1990/12/03  Age: 29 y.o.  Sex: female        Subjective:"I'm being induced"     Estimated Date of Delivery: 10/01/19  OB History   Gravida Para Term Preterm AB Living   4 2 2  0 2 2   SAB TAB Ectopic Molar Multiple Live Births   1 1 0 0 0 2      # Outcome Date GA Lbr Len/2nd Weight Sex Delivery Anes PTL Lv   4 Term 09/26/19 [redacted]w[redacted]d  3.52 kg F Vag-Spont EPI N LIV   3 TAB            2 SAB            1 Term                [name] is admitted with pregnancy at [redacted]w[redacted]d for induction of labor. Prenatal course was normal. Please see prenatal records for details.    Past Medical History:   Diagnosis Date   ??? Anemia    ??? Depression with anxiety    ??? Postpartum hemorrhage     retained placenta with first baby, no blood products received     No past surgical history on file.  Social History     Occupational History   ??? Not on file   Tobacco Use   ??? Smoking status: Former Smoker   ??? Smokeless tobacco: Never Used   ??? Tobacco comment: quit smoking at [redacted] weeks gestation   Vaping Use   ??? Vaping Use: Never used   Substance and Sexual Activity   ??? Alcohol use: Not Currently   ??? Drug use: Not Currently   ??? Sexual activity: Not on file     No family history on file.    No Known Allergies  Prior to Admission medications    Medication Sig Start Date End Date Taking? Authorizing Provider   prenatal multivit-ca-min-fe-fa tab Take 1 Tablet by mouth daily. Indications: pregnancy   Yes Provider, Historical   fluconazole (DIFLUCAN) 100 mg tablet Take 200 mg by mouth daily. FDA advises cautious prescribing of oral fluconazole in pregnancy.  Patient not taking: Reported on 09/25/2019    Other, Phys, MD   methocarbamol (ROBAXIN) 500 mg tablet Take 500 mg by mouth four (4) times daily as needed.  Patient not taking: Reported on 09/25/2019    Other, Phys, MD        Review of Systems: Constitutional: negative  Eyes: negative  Ears, nose, mouth, throat, and face:  negative  Respiratory: negative  Cardiovascular: negative  Gastrointestinal: negative  Genitourinary:negative  Integument/breast: negative  Hematologic/lymphatic: negative  Musculoskeletal:negative  Neurological: negative  Behavioral/Psych: negative  Endocrine: negative  Allergic/Immunologic: negative    Objective:     Vitals:  Vitals:    09/26/19 1913 09/26/19 2226 09/27/19 0605 09/27/19 1436   BP: 123/70 111/70 104/61 (!) 104/55   Pulse: (!) 104 85 78 80   Resp: 18 16 18 16    Temp: 99.1 ??F (37.3 ??C) 98.8 ??F (37.1 ??C) 98 ??F (36.7 ??C) 98.1 ??F (36.7 ??C)   SpO2: 100% 100% 100% 95%   Weight:       Height:            Physical Exam:  Heart: Regular rate and rhythm, S1S2 present or without murmur or extra heart sounds  Lung: clear to auscultation  throughout lung fields, no wheezes, no rales, no rhonchi and normal respiratory effort  Cervical Exam: 1 cm dilated    Membranes:  Intact  Fetal Heart Rate: Reactive    Prenatal Labs:   Lab Results   Component Value Date/Time    ABO/Rh(D) B Rh Positive 09/25/2019 08:30 AM    Rubella, External Immune 08/06/2019 12:00 AM    GrBStrep, External negative 08/21/2019 12:00 AM    HBsAg, External negative 08/06/2019 12:00 AM    HIV, External negative 08/06/2019 12:00 AM    RPR, External negative 08/06/2019 12:00 AM    Gonorrhea, External negative 08/06/2019 12:00 AM    Chlamydia, External negative 08/06/2019 12:00 AM         Assessment/Plan:     Active Problems:    Pregnancy (09/25/2019)         Plan: Admit for induction of labor.  Group B Strep was negative.    Signed By:  Janice Coffin, MD     September 25, 2019

## 2019-09-27 NOTE — Progress Notes (Signed)
Bedside and Verbal shift change report given to Gabrielle Glover, RN (oncoming nurse) by Carol Ann Adams, RN (offgoing nurse). Report included the following information SBAR, Intake/Output, MAR and Recent Results.

## 2019-09-28 MED FILL — OXYCODONE-ACETAMINOPHEN 5 MG-325 MG TAB: 5-325 mg | ORAL | Qty: 1

## 2019-09-28 MED FILL — IBUPROFEN 600 MG TAB: 600 mg | ORAL | Qty: 1

## 2019-09-28 MED FILL — SENNA LAX 8.6 MG TABLET: 8.6 mg | ORAL | Qty: 2

## 2019-09-28 NOTE — Discharge Summary (Signed)
Obstetrical Discharge Summary and Progress Note - VAGINAL DELIVERY    G 4 now P 2  Gestational Age: [redacted]w[redacted]d    Antepartum complications:  anemia, Depression with Anxiety, and previous c/section  Delivered By:   Doloris Hall a     Infant A Delivery Info  Date/Time of Delivery: 09/26/2019  @ 4:13 pm   Delivery Type:  Vaginal, Spontaneous   Sex: girl    Weight: 3.52 kg   Apgars:  1 minute: 8    5 minute: 9     Intrapartum complications:     Maternal:  None   Infant: None        Episiotomy/laceration:  Episiotomy: None   Laceration type: None      Reason for Admission:  pregnancy/delivery    Hospital Course:  uneventful    Infant feeding:  Infant Feeding: Formula      PPD # 1   S: Normal healing and involution. Denies any complaints or concerns. Pain well controlled with the  current analgesics.  Normal lochia.  Voiding without difficulty, tolerating regular   diet. + flatus.   Ambulating without problems.  The baby is stable. Patient strongly desires to go home.     O:   Visit Vitals  BP 107/72 (BP 1 Location: Left arm, BP Patient Position: Sitting)   Pulse 90   Temp 97.4 ??F (36.3 ??C)   Resp 17   Ht 5\' 4"  (1.626 m)   Wt 101.6 kg (224 lb)   SpO2 91%   Breastfeeding Unknown   BMI 38.45 kg/m??      LUNGS: Clear to auscultation.    CVS:  Regular rate and rhythm, no murmurs.    ABDOMEN:  Soft, nondistended, nontender, normally active bowel sounds   FUNDUS:  Firm, nontender, below the umbilicus   EXTREMITIES:  Min  edema    WBC   Date/Time Value Ref Range Status   09/27/2019 07:02 AM 13.3 (H) 4.0 - 11.0 1000/mm3 Final   09/25/2019 08:30 AM 8.2 4.0 - 11.0 1000/mm3 Final   01/07/2017 01:15 AM 6.3 4.0 - 11.0 1000/mm3 Final     HGB   Date/Time Value Ref Range Status   09/27/2019 07:02 AM 9.3 (L) 13.0 - 17.2 gm/dl Final   11/27/2019 76/72/0947 AM 10.5 (L) 13.0 - 17.2 gm/dl Final   09:62 83/66/2947 AM 13.3 13.0 - 17.2 gm/dl Final     PLATELET   Date/Time Value Ref Range Status   09/27/2019 07:02 AM 169 140 - 450 1000/mm3 Final          ASSESSMENT:  PPD 1   Doing well.      PLAN:  Discharge     DISCHARGE DATE:  09/28/2019    Current Discharge Medication List      START taking these medications    Details   ibuprofen (MOTRIN) 600 mg tablet Take 1 Tablet by mouth every six (6) hours as needed for Pain.  Qty: 30 Tablet, Refills: 1  Start date: 09/27/2019         CONTINUE these medications which have NOT CHANGED    Details   prenatal multivit-ca-min-fe-fa tab Take 1 Tablet by mouth daily. Indications: pregnancy         STOP taking these medications       fluconazole (DIFLUCAN) 100 mg tablet Comments:   Reason for Stopping:         methocarbamol (ROBAXIN) 500 mg tablet Comments:   Reason for Stopping:  Contraceptives:  Will discuss at 6 wk pp appt  Activity:  No driving, heavy lifting, pushing, pulling or strenuous activity for 2 weeks.  Pelvic rest for 6 weeks.   Diet: Regular  Take medication as prescribed and directed  Preprinted discharge instructions reviewed and given to patient.  Follow-up appointment in 6 weeks with OB Provider Dr. Mayford Knife, information given and knows to schedule appointment      Tiffney Haughton, CNM, 09/28/2019 10:05 AM

## 2020-05-14 ENCOUNTER — Telehealth: Payer: Self-pay

## 2020-05-14 NOTE — Telephone Encounter (Signed)
Open an error.

## 2020-06-26 IMAGING — US OBSTETRIC <14 WK ULTRASOUND
1 series · 14 of 28 positions shown · non-contrast
Comparison: None.

CLINICAL DATA: Vaginal bleeding

EXAM:
OBSTETRIC <14 WK US AND TRANSVAGINAL OB US
TECHNIQUE: Both transabdominal and transvaginal ultrasound examinations were
performed for complete evaluation of the gestation as well as the
maternal uterus, adnexal regions, and pelvic cul-de-sac.
Transvaginal technique was performed to assess early pregnancy.

[Series 1: obstetric <14 wk ultrasound · 40 acquisitions, 14 frames shown]
[im 2/40]
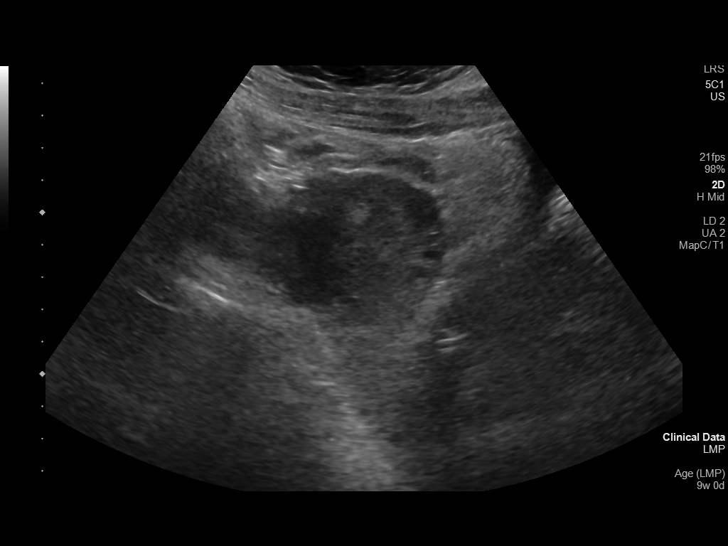
[im 5/40]
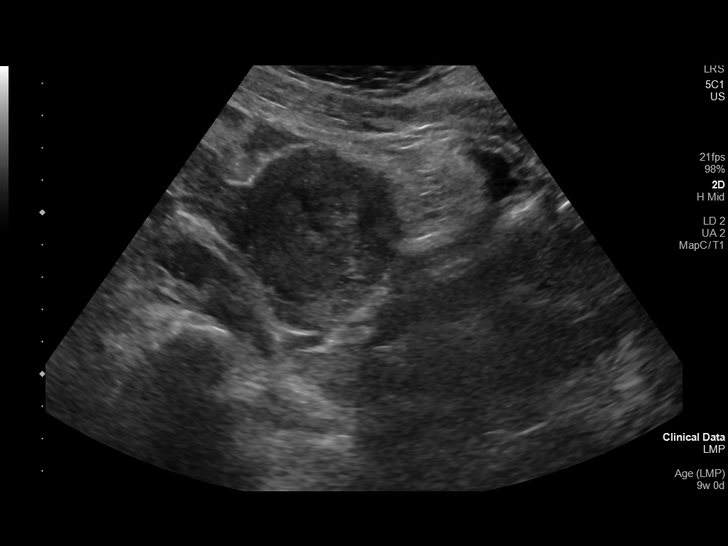
[im 8/40]
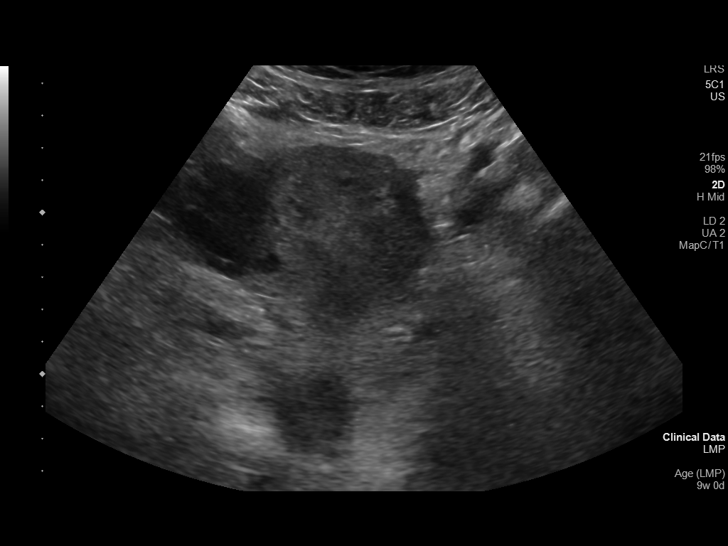
[im 11/40]
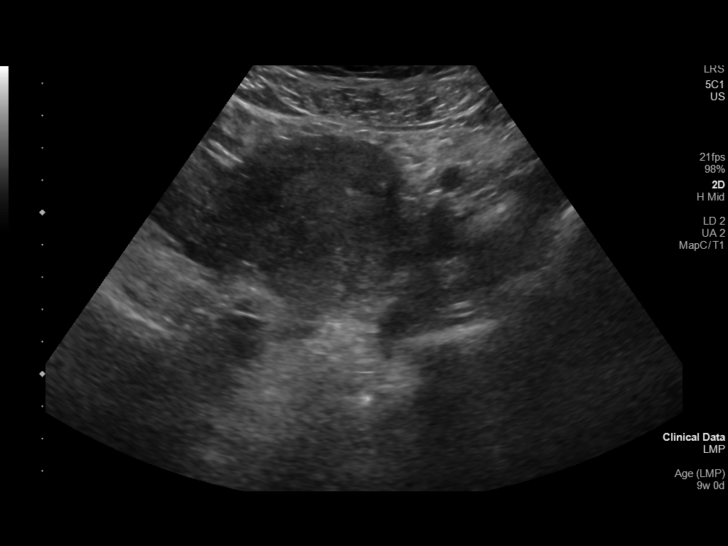
[im 14/40]
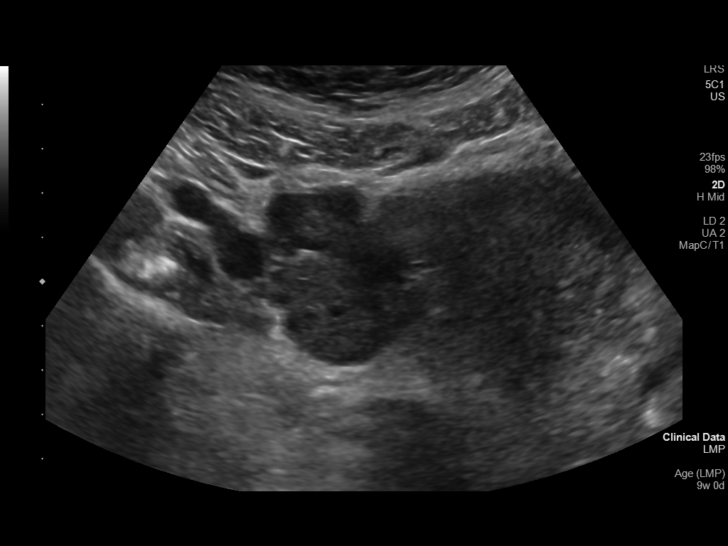
[im 16/40]
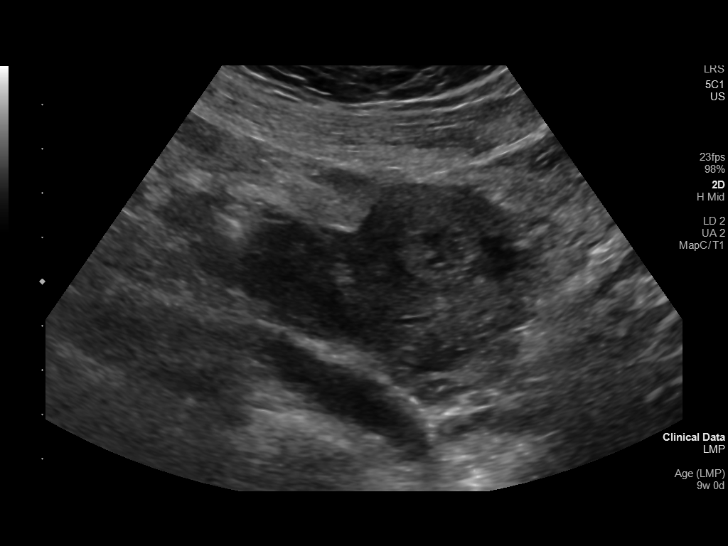
[im 19/40]
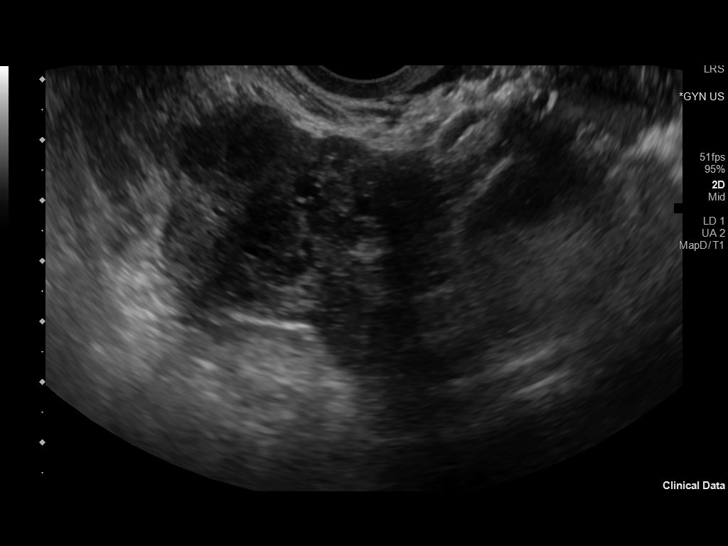
[im 22/40]
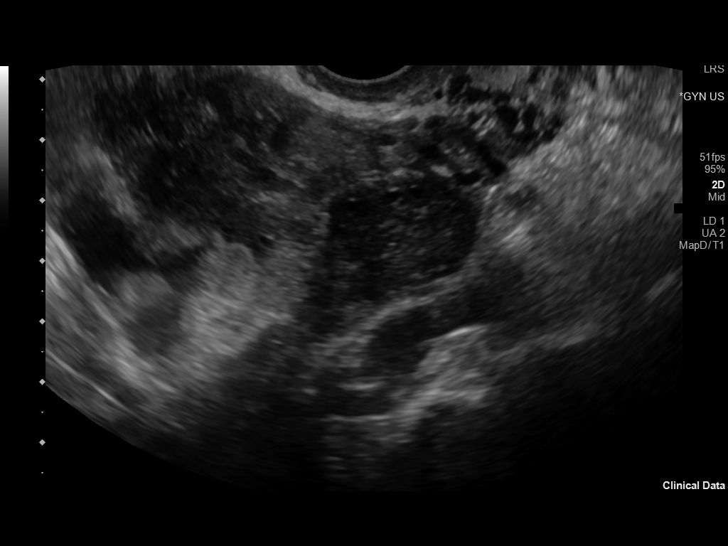
[im 25/40]
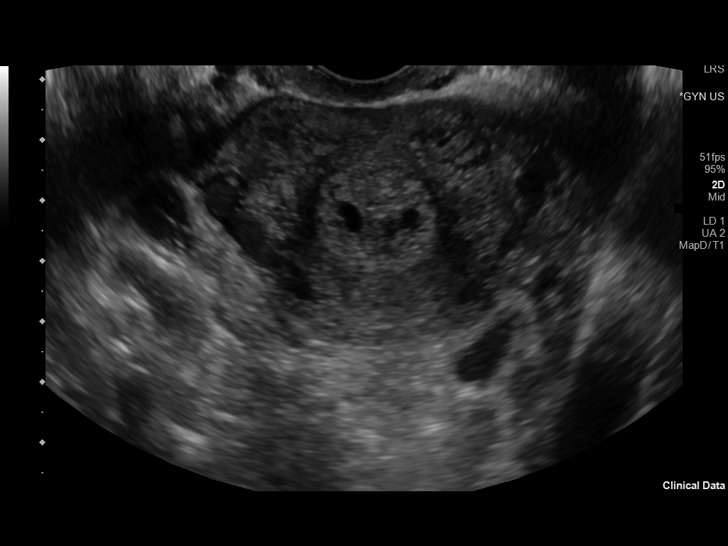
[im 28/40]
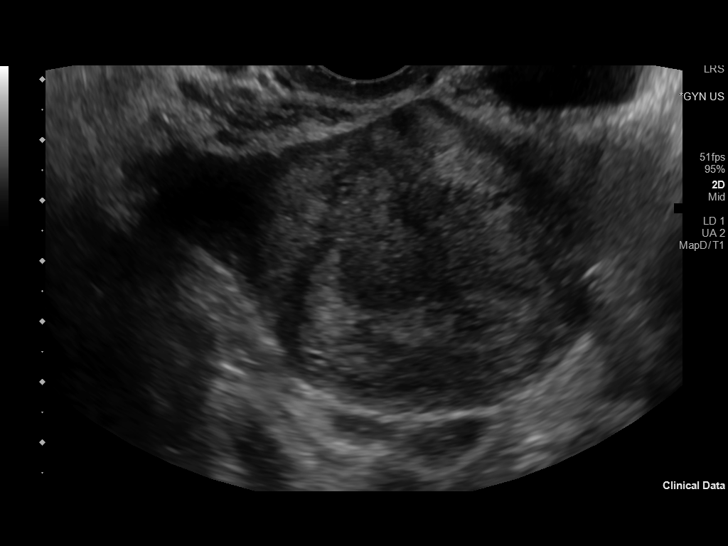
[im 31/40]
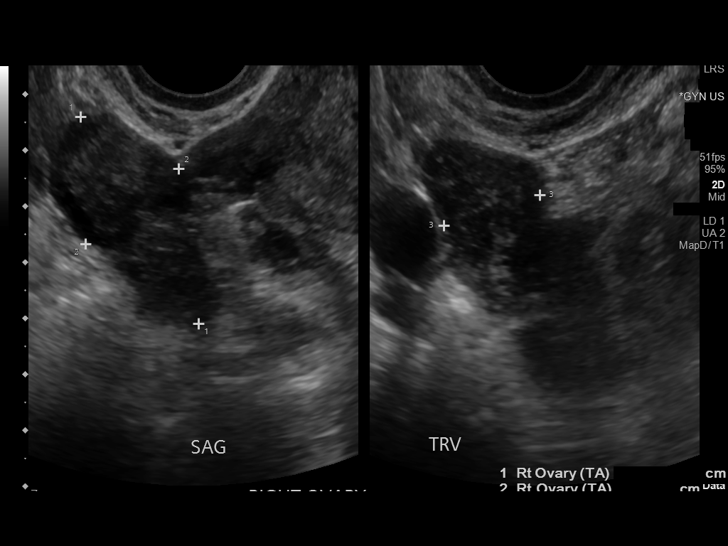
[im 34/40]
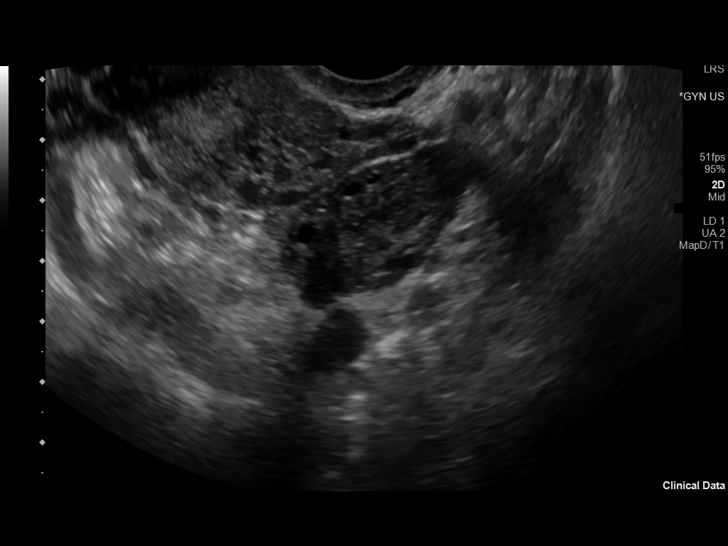
[im 37/40]
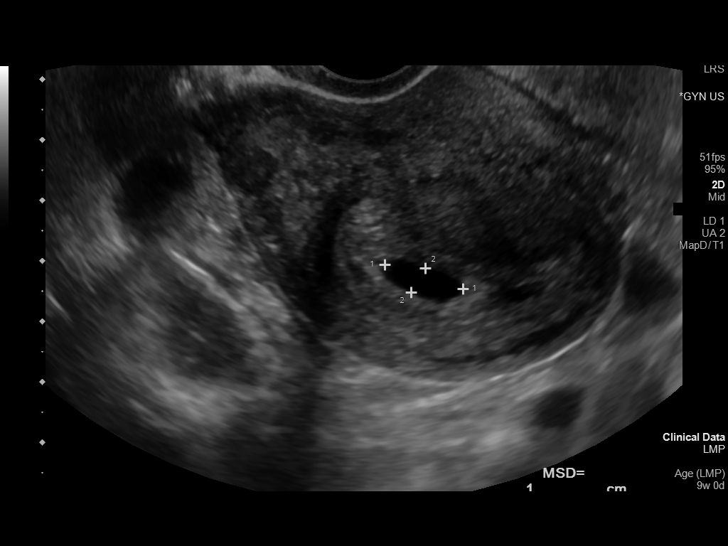
[im 40/40]
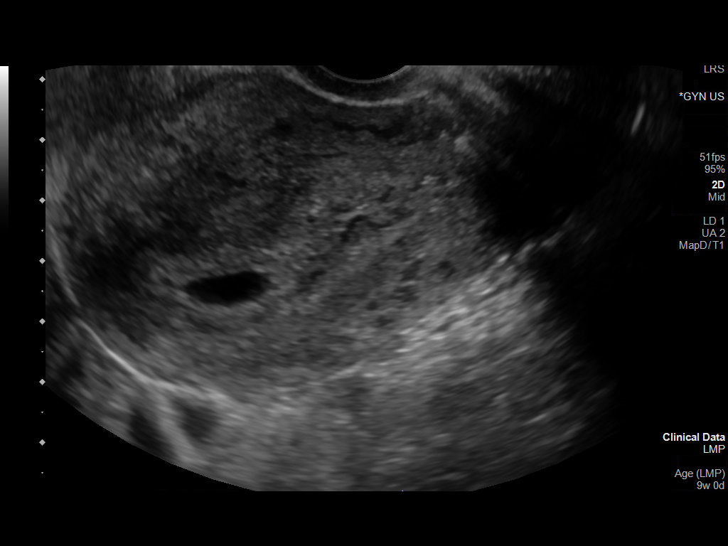

[14 of 28 positions shown; findings below may reference images not displayed]

FINDINGS: Intrauterine gestational sac: Single

Yolk sac:  Not visualized

Embryo:  Not visualized

Cardiac Activity: Not visualized

Heart Rate:   bpm

MSD: 8.0 mm   5 w   4 d

CRL:    mm    w    d                  US EDC:

Subchorionic hemorrhage:  Moderate subchorionic hemorrhage

Maternal uterus/adnexae: No adnexal mass or free fluid.
IMPRESSION: Probable early intrauterine gestational sac, but no yolk sac, fetal
pole, or cardiac activity yet visualized. Recommend follow-up
quantitative B-HCG levels and follow-up US in 14 days to assess
viability. This recommendation follows SRU consensus guidelines:
Diagnostic Criteria for Nonviable Pregnancy Early in the First
Trimester. N Engl J Med 8339; [DATE].

Moderate subchorionic hemorrhage.

## 2020-07-09 IMAGING — US OBSTETRIC <14 WK US AND TRANSVAGINAL OB US
1 series · 15 of 28 positions shown · non-contrast
Comparison: Obstetric ultrasound 13 days prior 08/02/2018

CLINICAL DATA: Pregnant patient in first-trimester pregnancy with
vaginal bleeding and abdominal cramping. Pregnancy of unknown
location.

EXAM:
OBSTETRIC <14 WK US AND TRANSVAGINAL OB US
TECHNIQUE: Both transabdominal and transvaginal ultrasound examinations were
performed for complete evaluation of the gestation as well as the
maternal uterus, adnexal regions, and pelvic cul-de-sac.
Transvaginal technique was performed to assess early pregnancy.

[Series 1: obstetric <14 wk us and transvaginal ob us · 81 acquisitions, 15 frames shown]
[im 1/81]
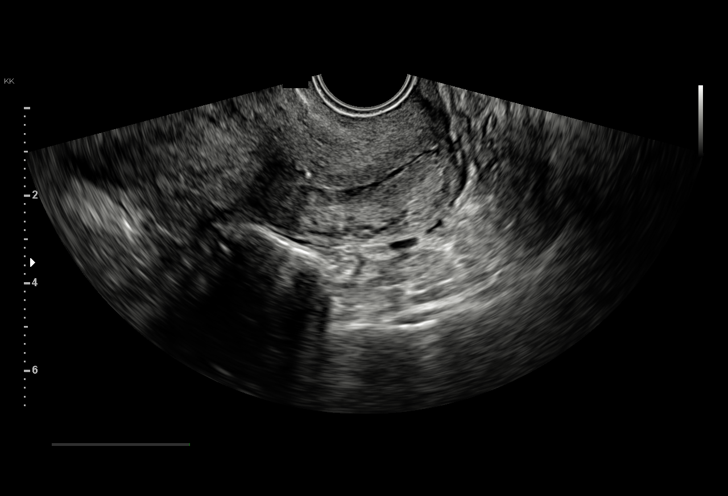
[im 6/81]
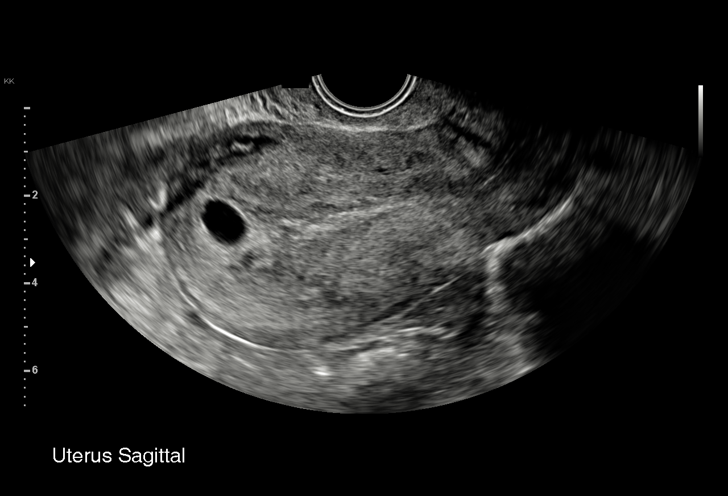
[im 12/81]
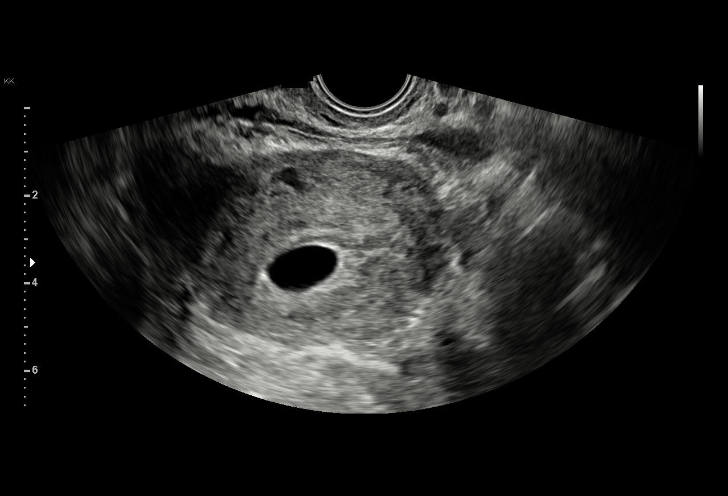
[im 18/81]
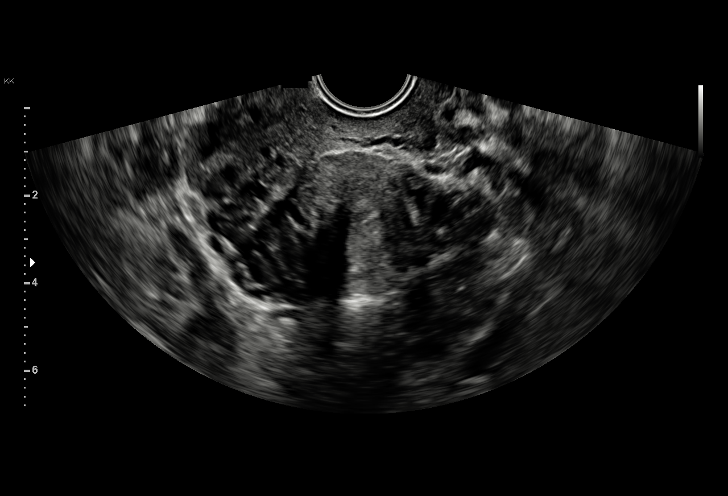
[im 24/81]
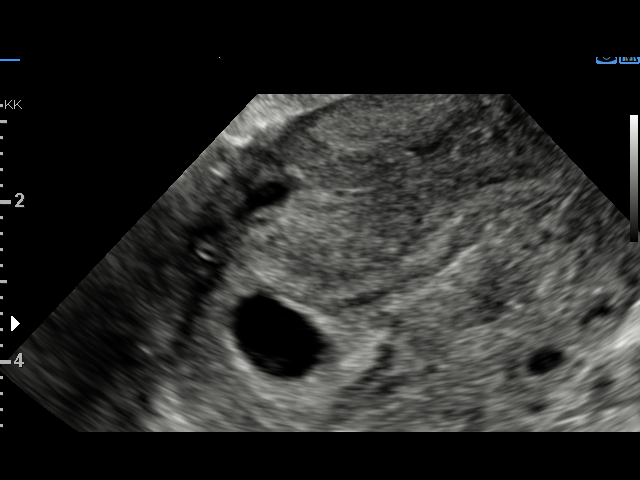
[im 30/81]
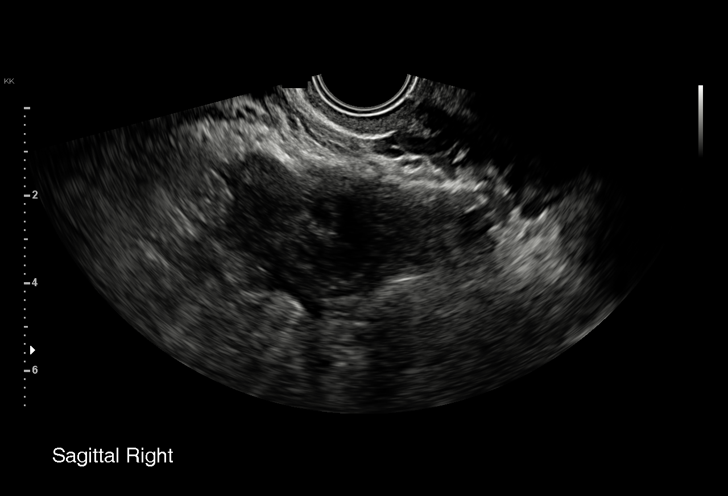
[im 36/81]
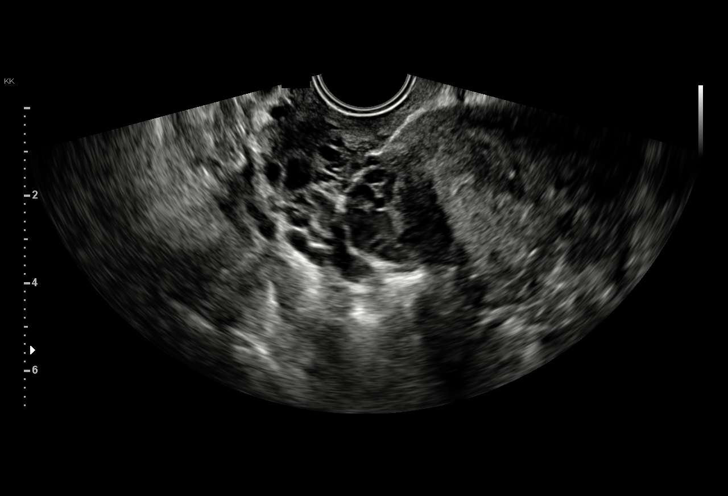
[im 42/81]
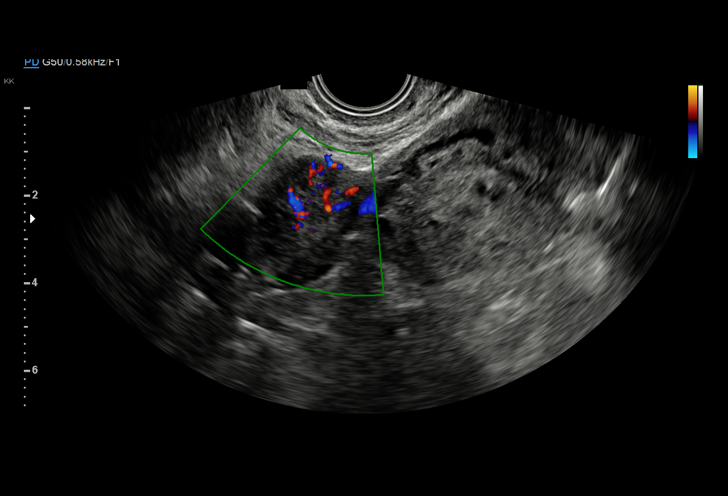
[im 45/81]
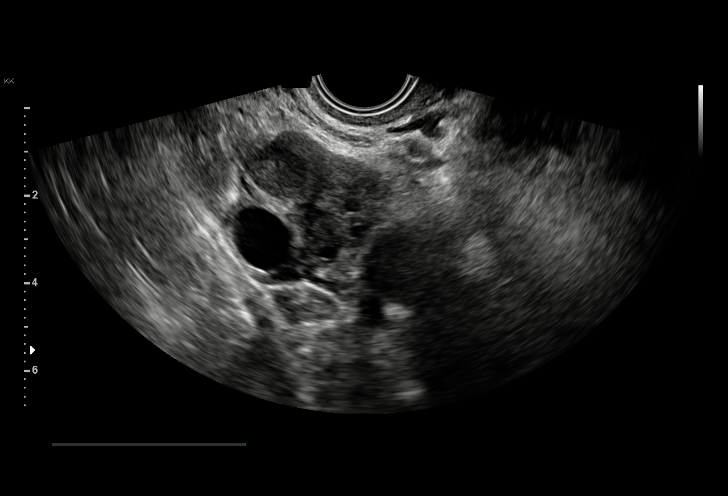
[im 51/81]
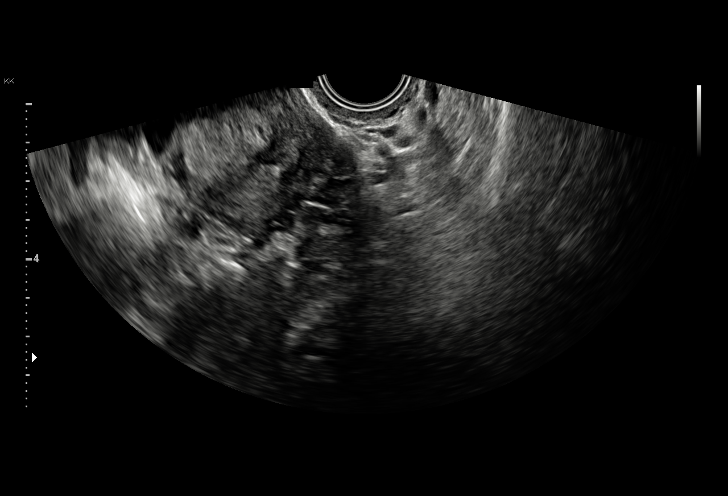
[im 57/81]
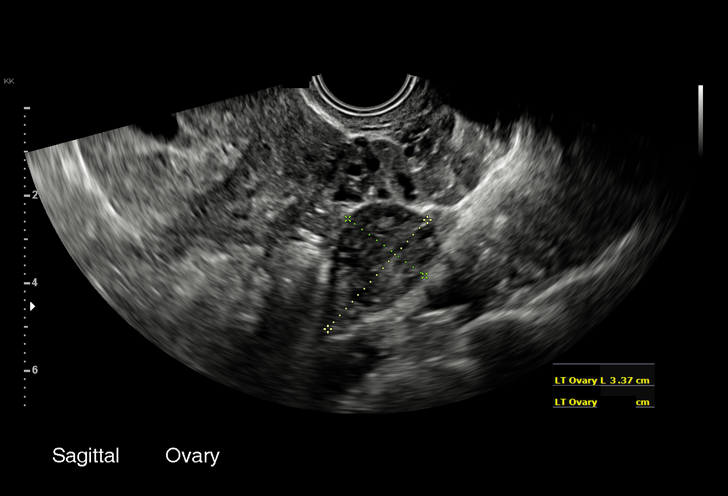
[im 63/81]
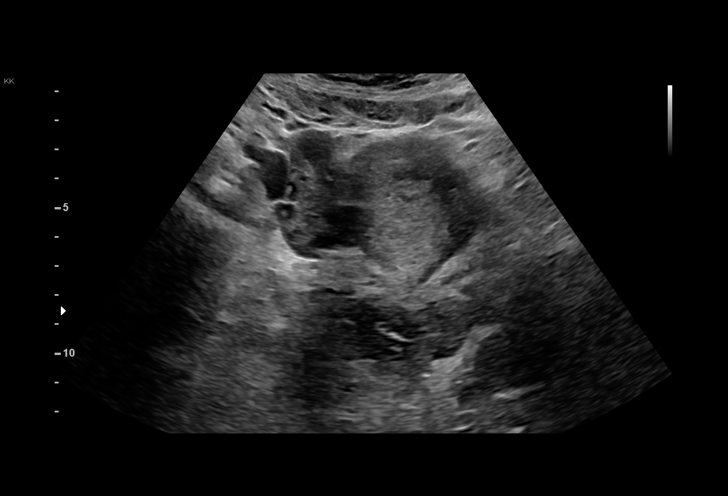
[im 69/81]
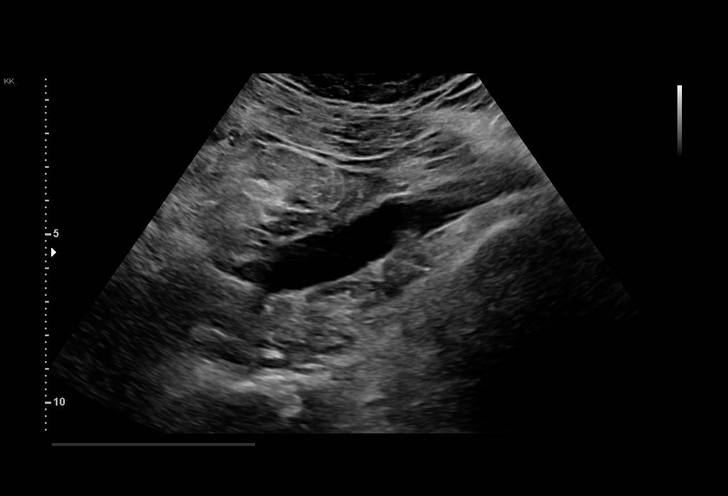
[im 75/81]
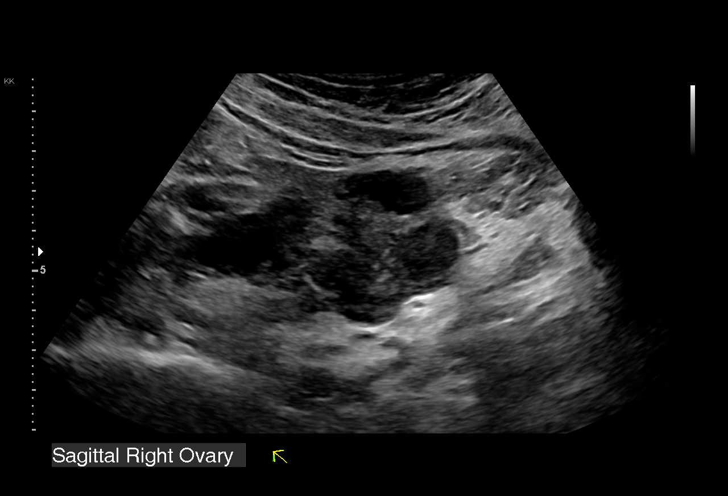
[im 81/81]
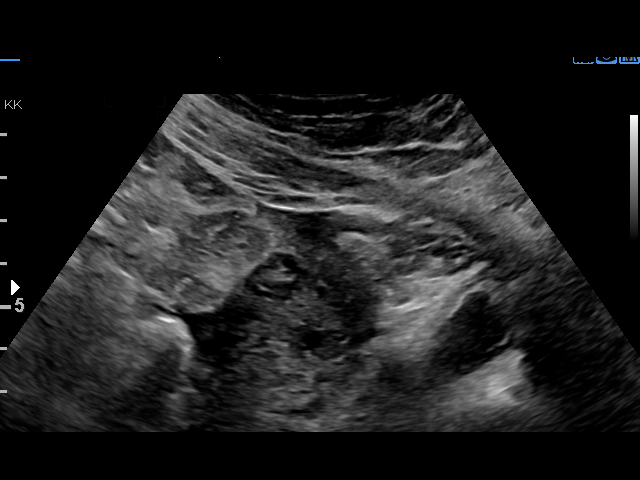

[15 of 28 positions shown; findings below may reference images not displayed]

FINDINGS: Intrauterine gestational sac: Single

Yolk sac:  Not Visualized.

Embryo:  Not Visualized.

Cardiac Activity: Not Visualized.

MSD: 14 mm   6 w   1 d

Subchorionic hemorrhage:  Small, decreased in size from prior exam.

Maternal uterus/adnexae: Cyst in the right ovary measuring
approximately 16 mm, which is likely a corpus luteum. Left ovary is
normal. Blood flow noted to both ovaries. Trace pelvic free fluid.
No suspicious adnexal mass.
IMPRESSION: 1. Intrauterine gestational sac but no yolk sac, fetal pole, or
cardiac activity. Findings are highly suspicious but not diagnostic
for nonviable pregnancy. The gestational sac is slightly larger than
on exam 13 days ago.
2. Small subchorionic hemorrhage which has slightly diminished from
prior.
3. Probable corpus luteal cyst in the right ovary.

## 2020-07-15 IMAGING — US TRANSVAGINAL OB ULTRASOUND
1 series · 15 of 28 positions shown · non-contrast
Comparison: 08/15/2018

CLINICAL DATA: Pain and bleeding

EXAM:
TRANSVAGINAL OB ULTRASOUND
TECHNIQUE: Transvaginal ultrasound was performed for complete evaluation of the
gestation as well as the maternal uterus, adnexal regions, and
pelvic cul-de-sac.

[Series 1: transvaginal ob ultrasound · 15 of 28 slices shown]
[im 1/28]
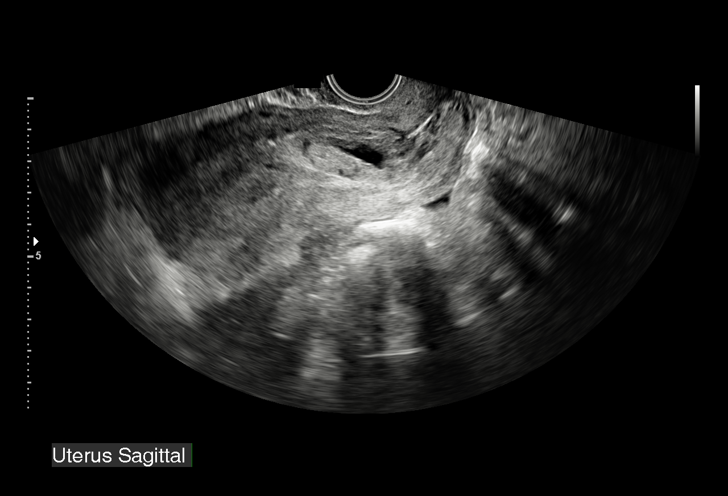
[im 3/28]
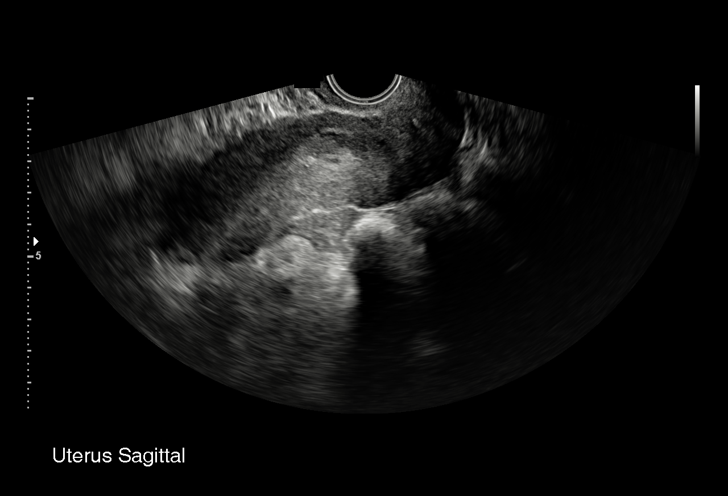
[im 5/28]
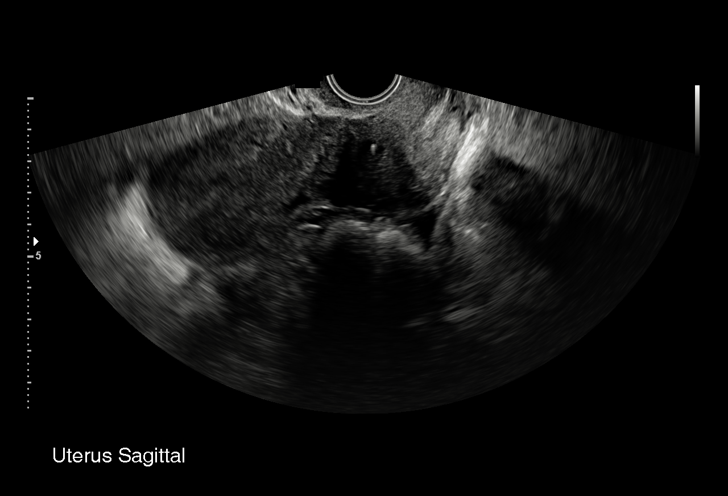
[im 7/28]
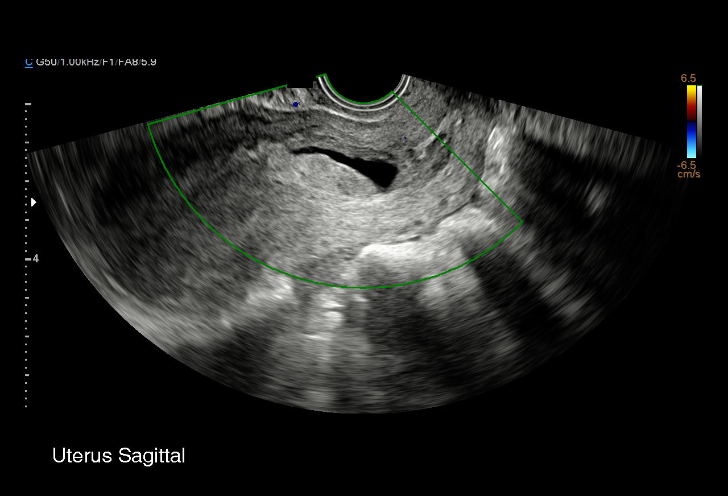
[im 9/28]
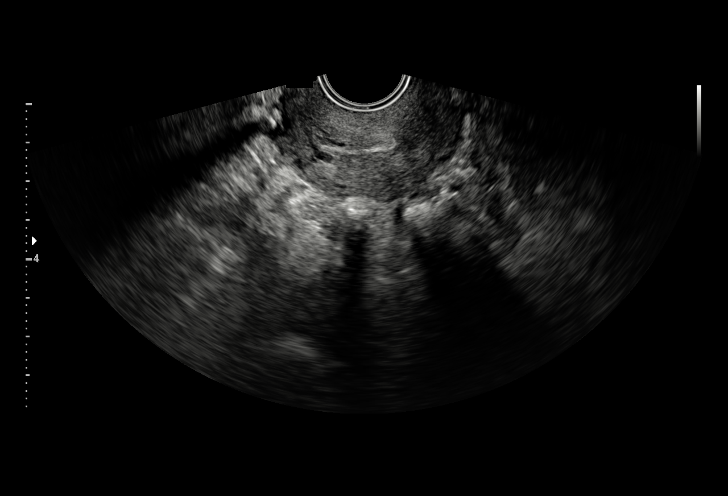
[im 11/28]
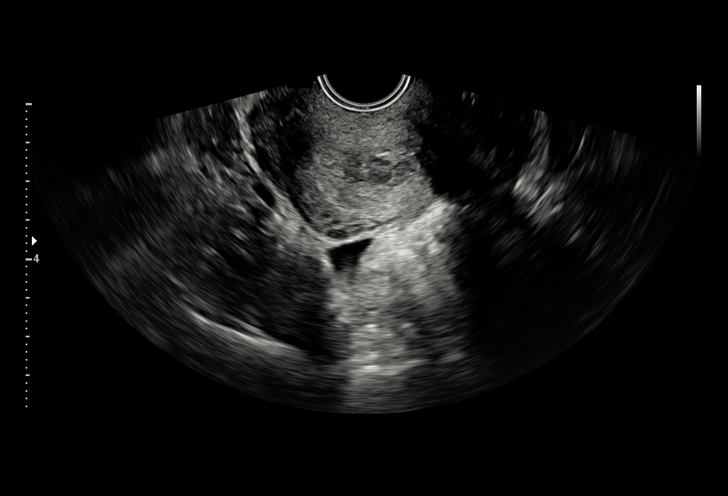
[im 13/28]
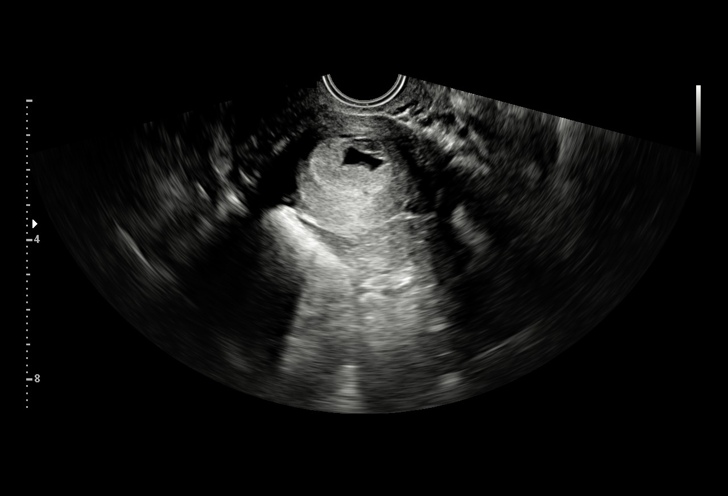
[im 15/28]
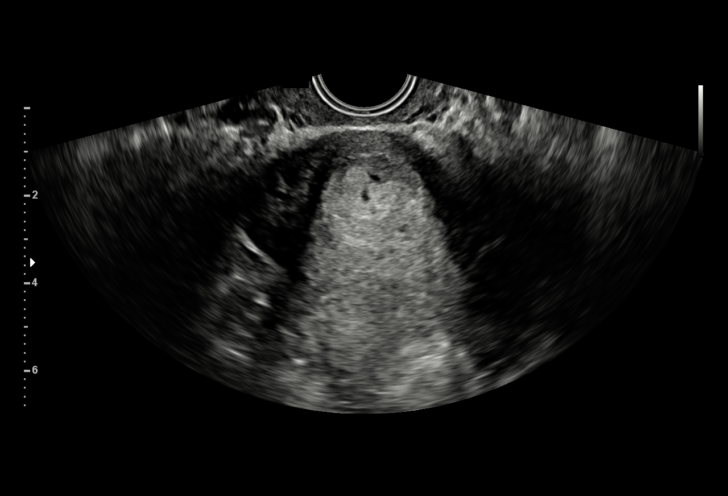
[im 16/28]
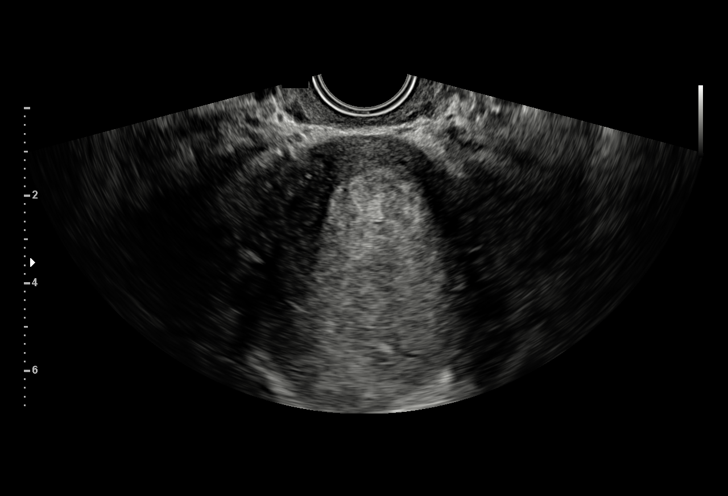
[im 18/28]
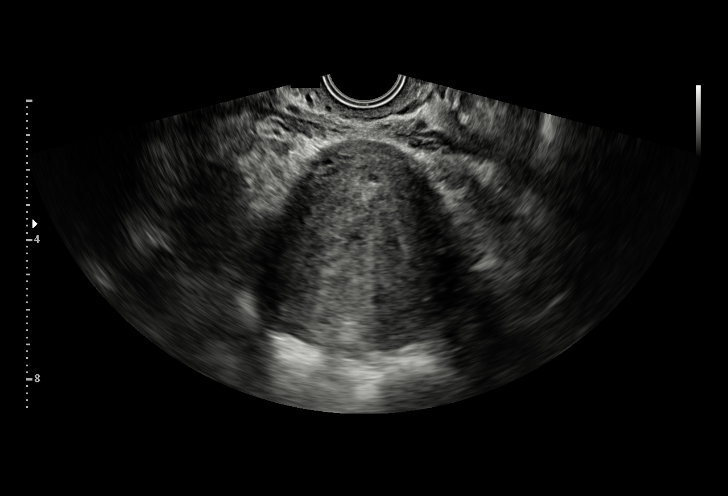
[im 20/28]
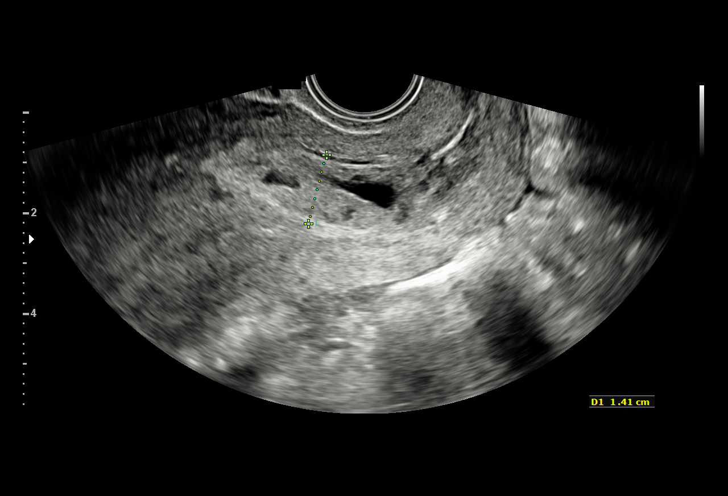
[im 22/28]
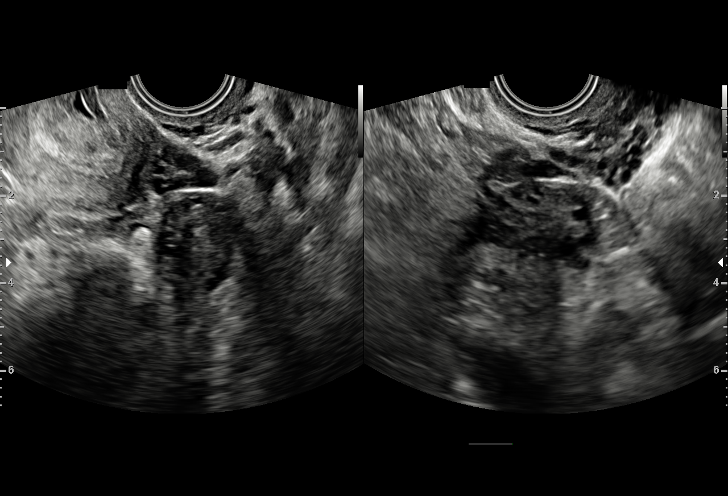
[im 24/28]
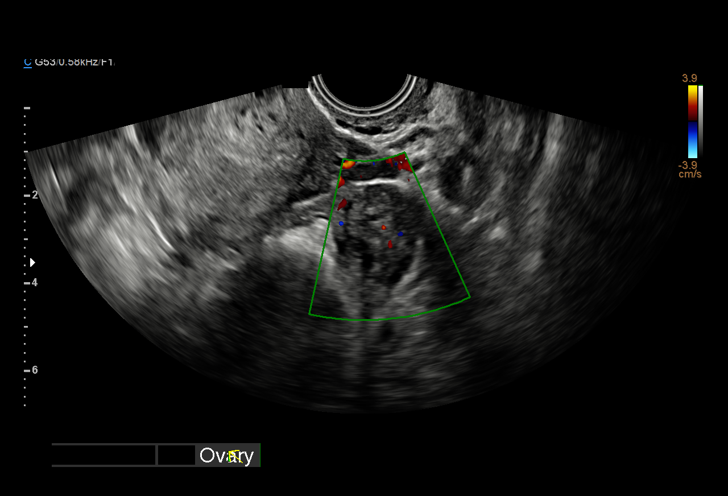
[im 26/28]
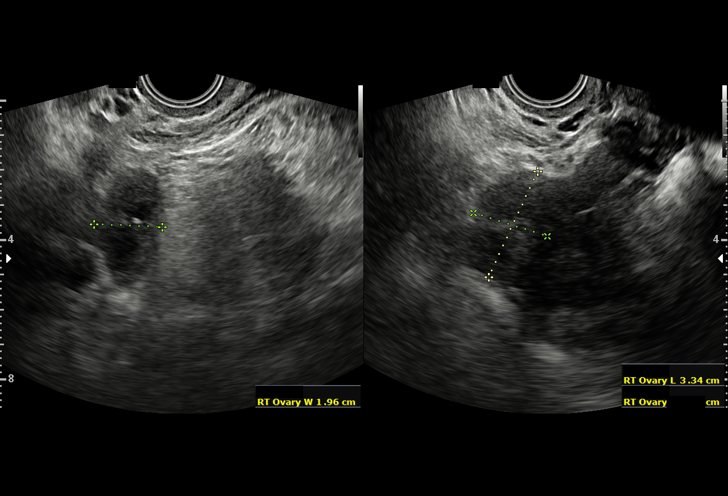
[im 28/28]
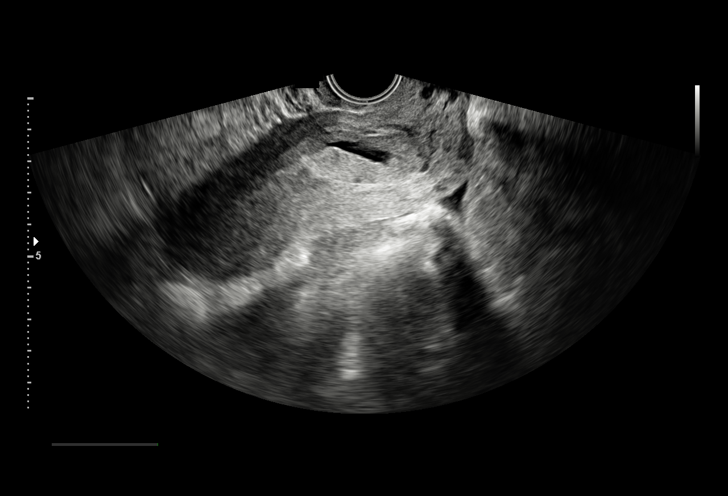

[15 of 28 positions shown; findings below may reference images not displayed]

FINDINGS: Intrauterine gestational sac: Irregular fluid collection in the
lower uterine segment/upper cervix.

Yolk sac:  Not seen

Embryo:  Not seen

Subchorionic hemorrhage:  None visualized.

Maternal uterus/adnexae: Bilateral ovaries are within normal limits.
Left ovary measures 1.9 x 2.7 x 1.7 cm. Right ovary measures 3.3 x
2.2 x 2 cm. Trace free fluid.
IMPRESSION: 1. Irregular fluid collection now visible within the lower uterine
segment/upper cervix. No yolk sac or embryo identified.
2. Trace free fluid.

## 2021-01-11 IMAGING — US US OB COMP LESS 14 WK
1 series · 15 of 20 positions shown · non-contrast
Comparison: None this pregnancy.  Obstetric ultrasound 08/21/2018.

CLINICAL DATA: Pregnant patient with pregnancy of unknown anatomic
location. Gestational age based on last menstrual period of 9 weeks
1 day. Abdominal and back pain.

EXAM:
OBSTETRIC <14 WK ULTRASOUND
TECHNIQUE: Transabdominal ultrasound was performed for evaluation of the
gestation as well as the maternal uterus and adnexal regions.

[Series 1: us ob comp less 14 wk · 20 acquisitions, 15 frames shown]
[im 1/20]
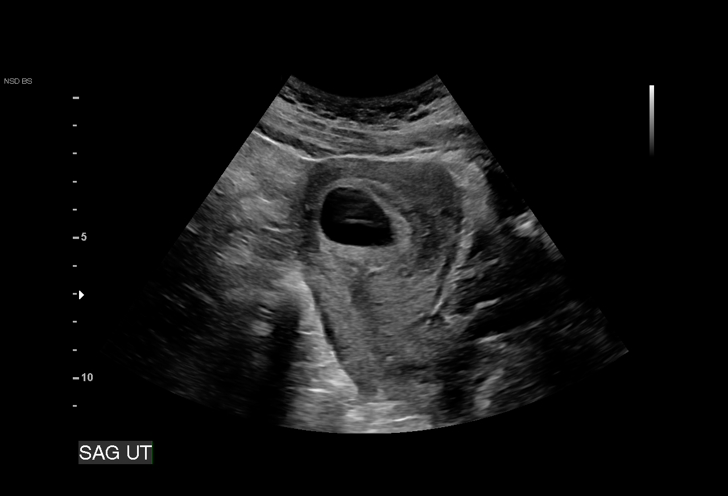
[im 3/20]
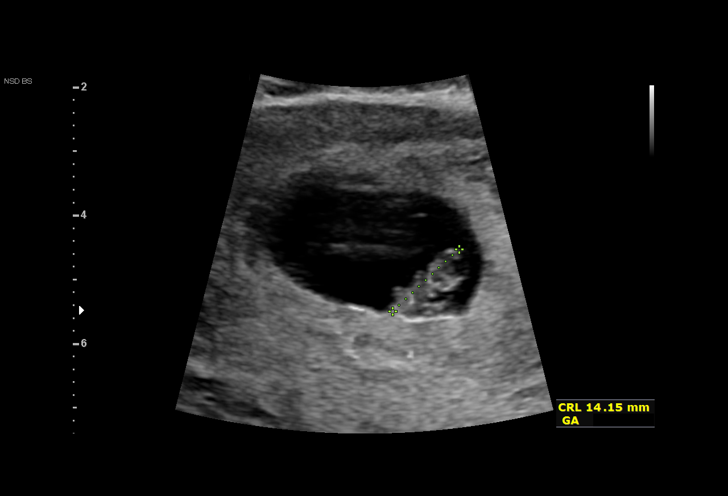
[im 4/20]
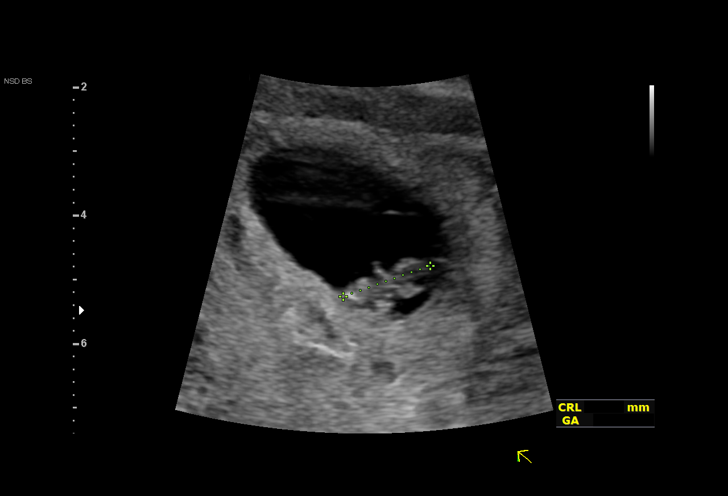
[im 5/20]
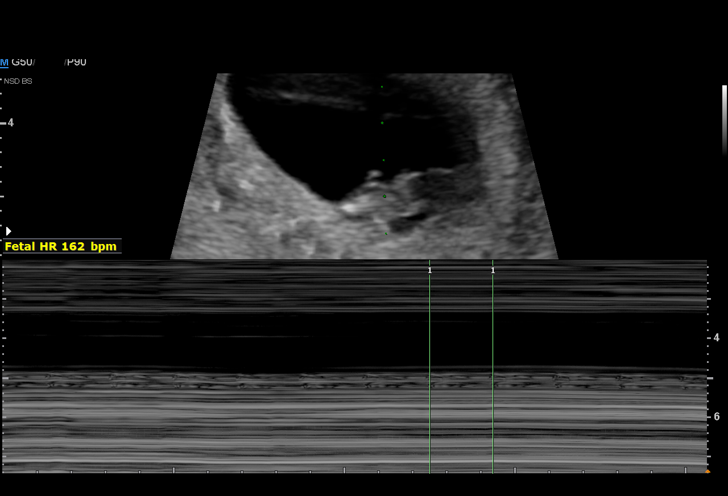
[im 7/20]
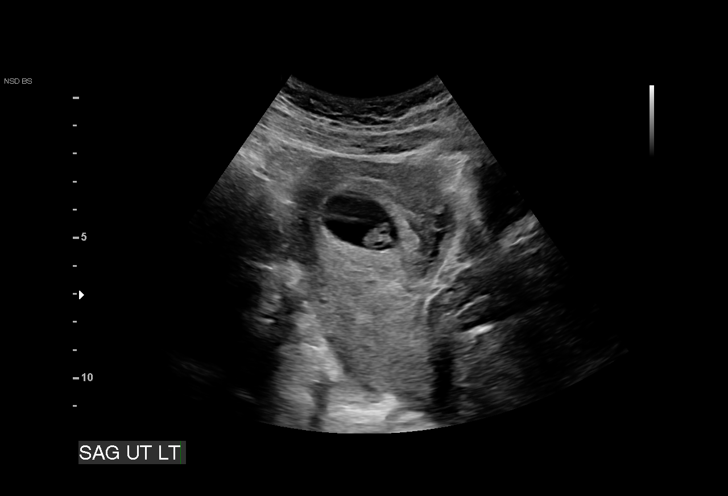
[im 8/20]
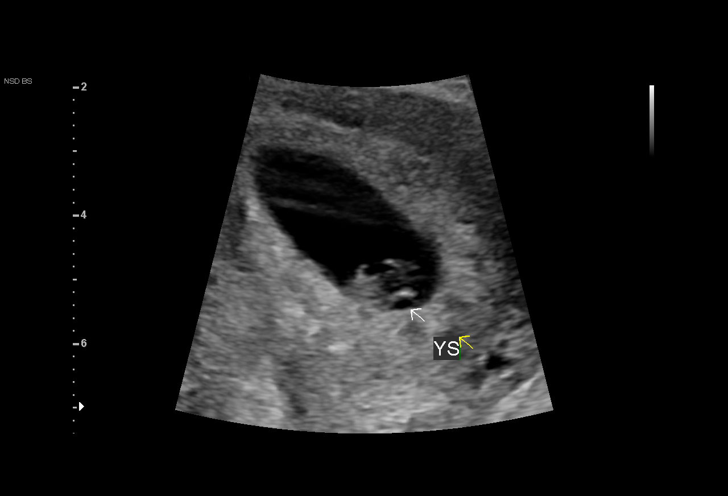
[im 9/20]
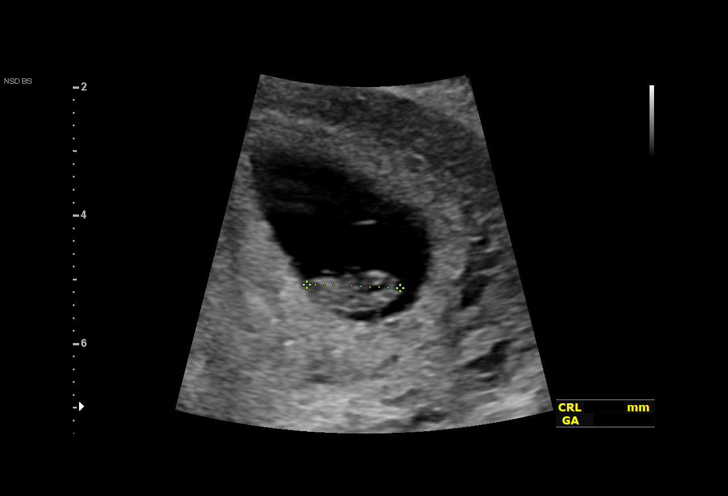
[im 11/20]
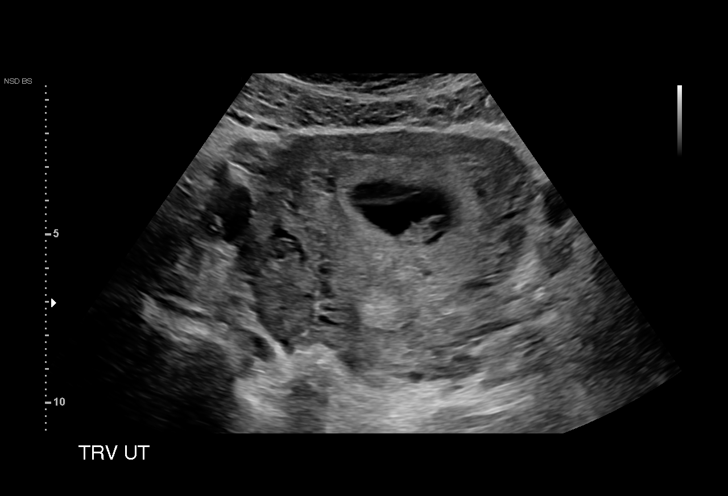
[im 12/20]
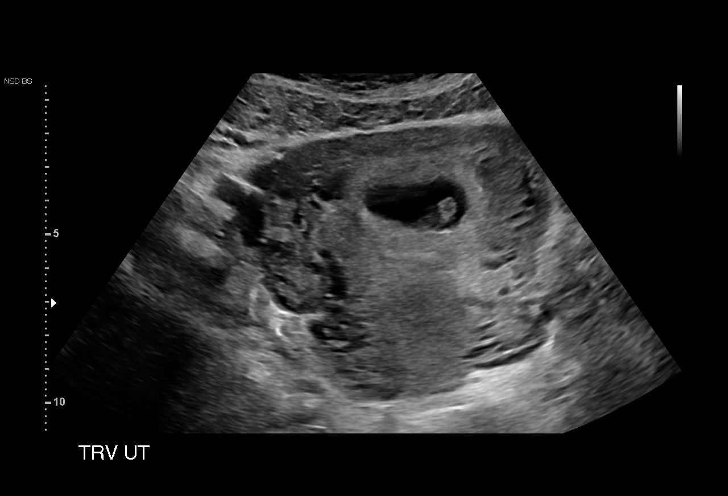
[im 13/20]
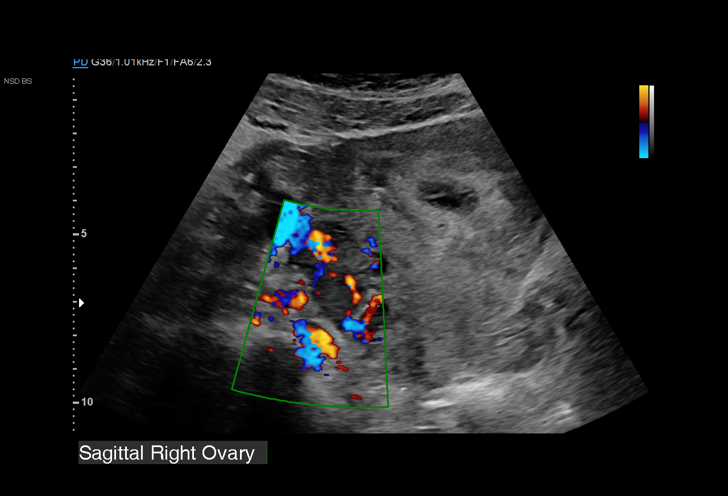
[im 15/20]
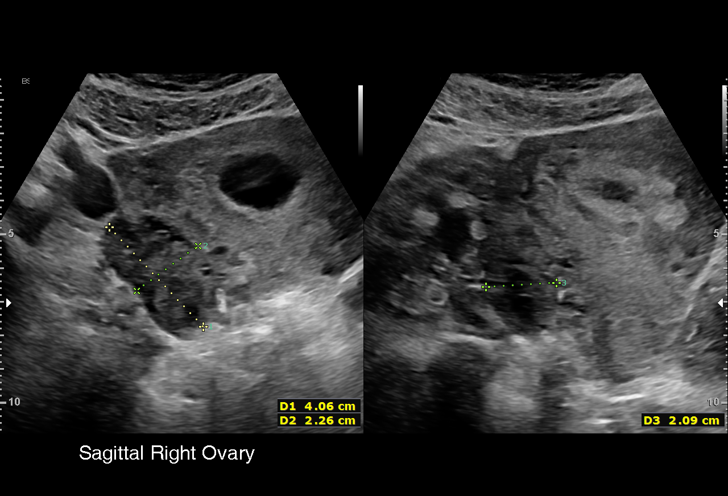
[im 16/20]
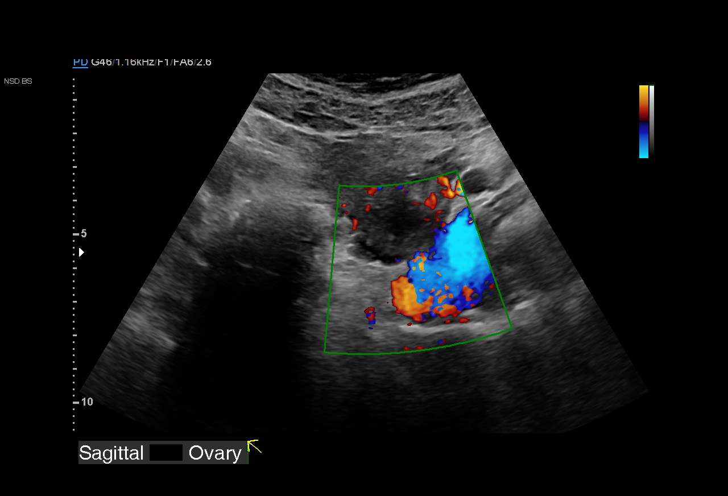
[im 17/20]
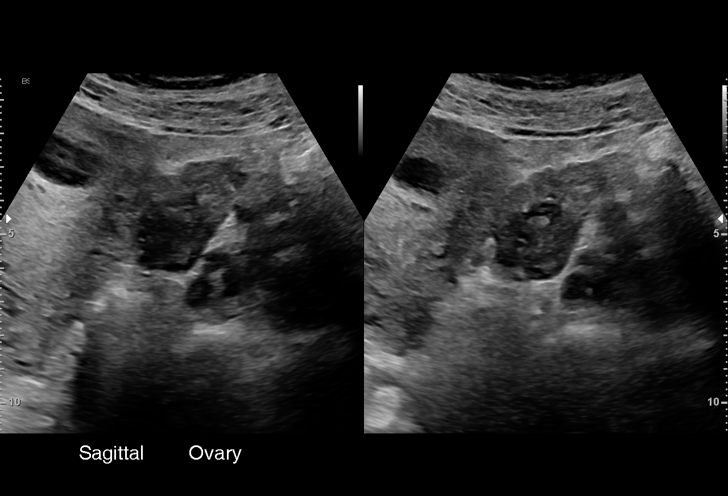
[im 19/20]
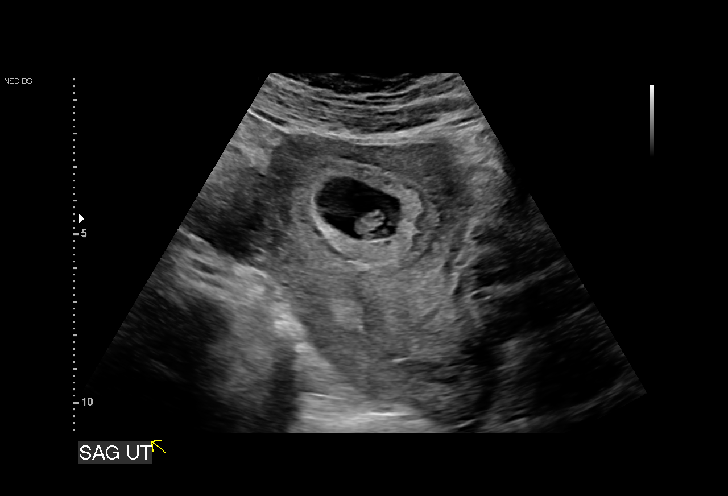
[im 20/20]
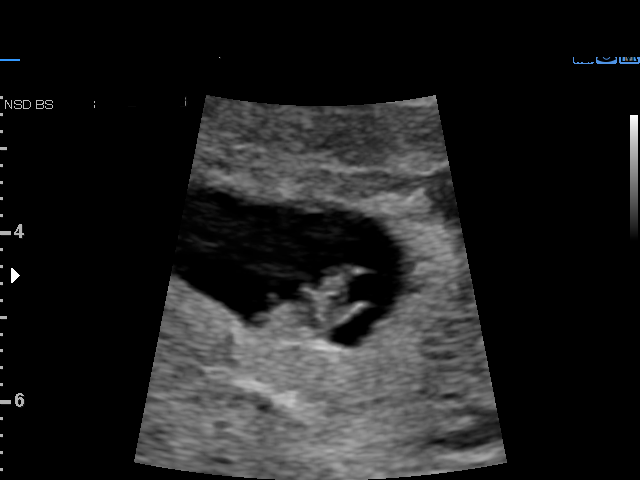

[15 of 20 positions shown; findings below may reference images not displayed]

FINDINGS: Intrauterine gestational sac: Single

Yolk sac:  Visualized.

Embryo:  Visualized.

Cardiac Activity: Visualized.

Heart Rate: 162 bpm

CRL:   14.3 mm   7 w 5 d                  US EDC: 10/01/2019

Subchorionic hemorrhage:  None visualized.

Maternal uterus/adnexae: Both ovaries are visualized and are normal.
No pelvic free fluid. No adnexal mass.
IMPRESSION: Single live intrauterine pregnancy estimated gestational age 7 weeks
5 days based on crown-rump length for ultrasound EDC 10/01/2019. No
subchorionic hemorrhage.

## 2022-10-03 DIAGNOSIS — Z6841 Body Mass Index (BMI) 40.0 and over, adult: Secondary | ICD-10-CM | POA: Diagnosis not present

## 2022-10-03 DIAGNOSIS — Z0000 Encounter for general adult medical examination without abnormal findings: Secondary | ICD-10-CM | POA: Diagnosis not present

## 2022-10-03 DIAGNOSIS — R8290 Unspecified abnormal findings in urine: Secondary | ICD-10-CM | POA: Diagnosis not present

## 2022-10-03 DIAGNOSIS — F319 Bipolar disorder, unspecified: Secondary | ICD-10-CM | POA: Diagnosis not present

## 2022-10-03 DIAGNOSIS — B009 Herpesviral infection, unspecified: Secondary | ICD-10-CM | POA: Diagnosis not present

## 2022-10-03 DIAGNOSIS — R7303 Prediabetes: Secondary | ICD-10-CM | POA: Diagnosis not present

## 2022-10-05 DIAGNOSIS — Z01419 Encounter for gynecological examination (general) (routine) without abnormal findings: Secondary | ICD-10-CM | POA: Diagnosis not present

## 2022-10-05 DIAGNOSIS — N898 Other specified noninflammatory disorders of vagina: Secondary | ICD-10-CM | POA: Diagnosis not present

## 2022-10-05 DIAGNOSIS — Z0000 Encounter for general adult medical examination without abnormal findings: Secondary | ICD-10-CM | POA: Diagnosis not present

## 2022-10-05 DIAGNOSIS — Z309 Encounter for contraceptive management, unspecified: Secondary | ICD-10-CM | POA: Diagnosis not present

## 2023-05-18 DIAGNOSIS — R630 Anorexia: Secondary | ICD-10-CM | POA: Diagnosis not present

## 2023-05-18 DIAGNOSIS — R7303 Prediabetes: Secondary | ICD-10-CM | POA: Diagnosis not present

## 2023-05-18 DIAGNOSIS — R634 Abnormal weight loss: Secondary | ICD-10-CM | POA: Diagnosis not present

## 2023-05-18 DIAGNOSIS — F411 Generalized anxiety disorder: Secondary | ICD-10-CM | POA: Diagnosis not present

## 2023-05-18 DIAGNOSIS — F319 Bipolar disorder, unspecified: Secondary | ICD-10-CM | POA: Diagnosis not present

## 2023-05-18 DIAGNOSIS — R197 Diarrhea, unspecified: Secondary | ICD-10-CM | POA: Diagnosis not present

## 2023-05-24 DIAGNOSIS — K581 Irritable bowel syndrome with constipation: Secondary | ICD-10-CM | POA: Diagnosis not present

## 2023-05-24 DIAGNOSIS — R152 Fecal urgency: Secondary | ICD-10-CM | POA: Diagnosis not present

## 2023-05-26 DIAGNOSIS — R630 Anorexia: Secondary | ICD-10-CM | POA: Diagnosis not present

## 2023-05-26 DIAGNOSIS — R197 Diarrhea, unspecified: Secondary | ICD-10-CM | POA: Diagnosis not present

## 2023-05-26 DIAGNOSIS — R634 Abnormal weight loss: Secondary | ICD-10-CM | POA: Diagnosis not present

## 2023-05-26 DIAGNOSIS — K581 Irritable bowel syndrome with constipation: Secondary | ICD-10-CM | POA: Diagnosis not present

## 2023-08-04 DIAGNOSIS — F5104 Psychophysiologic insomnia: Secondary | ICD-10-CM | POA: Diagnosis not present

## 2023-08-04 DIAGNOSIS — R61 Generalized hyperhidrosis: Secondary | ICD-10-CM | POA: Diagnosis not present

## 2023-08-04 DIAGNOSIS — F411 Generalized anxiety disorder: Secondary | ICD-10-CM | POA: Diagnosis not present

## 2023-08-04 DIAGNOSIS — F319 Bipolar disorder, unspecified: Secondary | ICD-10-CM | POA: Diagnosis not present

## 2023-08-04 DIAGNOSIS — R6889 Other general symptoms and signs: Secondary | ICD-10-CM | POA: Diagnosis not present

## 2023-08-04 DIAGNOSIS — F902 Attention-deficit hyperactivity disorder, combined type: Secondary | ICD-10-CM | POA: Diagnosis not present

## 2023-09-01 DIAGNOSIS — R61 Generalized hyperhidrosis: Secondary | ICD-10-CM | POA: Diagnosis not present

## 2023-09-01 DIAGNOSIS — F411 Generalized anxiety disorder: Secondary | ICD-10-CM | POA: Diagnosis not present

## 2023-09-01 DIAGNOSIS — F319 Bipolar disorder, unspecified: Secondary | ICD-10-CM | POA: Diagnosis not present

## 2023-09-01 DIAGNOSIS — F5104 Psychophysiologic insomnia: Secondary | ICD-10-CM | POA: Diagnosis not present

## 2023-09-01 DIAGNOSIS — F902 Attention-deficit hyperactivity disorder, combined type: Secondary | ICD-10-CM | POA: Diagnosis not present

## 2023-10-06 DIAGNOSIS — F902 Attention-deficit hyperactivity disorder, combined type: Secondary | ICD-10-CM | POA: Diagnosis not present

## 2023-10-06 DIAGNOSIS — F319 Bipolar disorder, unspecified: Secondary | ICD-10-CM | POA: Diagnosis not present

## 2023-10-06 DIAGNOSIS — R61 Generalized hyperhidrosis: Secondary | ICD-10-CM | POA: Diagnosis not present

## 2023-10-06 DIAGNOSIS — F5104 Psychophysiologic insomnia: Secondary | ICD-10-CM | POA: Diagnosis not present

## 2023-10-06 DIAGNOSIS — F411 Generalized anxiety disorder: Secondary | ICD-10-CM | POA: Diagnosis not present

## 2023-11-27 DIAGNOSIS — Z124 Encounter for screening for malignant neoplasm of cervix: Secondary | ICD-10-CM | POA: Diagnosis not present

## 2023-11-27 DIAGNOSIS — F5104 Psychophysiologic insomnia: Secondary | ICD-10-CM | POA: Diagnosis not present

## 2023-11-27 DIAGNOSIS — F411 Generalized anxiety disorder: Secondary | ICD-10-CM | POA: Diagnosis not present

## 2023-11-27 DIAGNOSIS — Z113 Encounter for screening for infections with a predominantly sexual mode of transmission: Secondary | ICD-10-CM | POA: Diagnosis not present

## 2023-11-27 DIAGNOSIS — F319 Bipolar disorder, unspecified: Secondary | ICD-10-CM | POA: Diagnosis not present

## 2023-11-27 DIAGNOSIS — F902 Attention-deficit hyperactivity disorder, combined type: Secondary | ICD-10-CM | POA: Diagnosis not present

## 2023-11-27 DIAGNOSIS — Z0000 Encounter for general adult medical examination without abnormal findings: Secondary | ICD-10-CM | POA: Diagnosis not present

## 2023-11-27 DIAGNOSIS — R61 Generalized hyperhidrosis: Secondary | ICD-10-CM | POA: Diagnosis not present

## 2023-11-27 DIAGNOSIS — L309 Dermatitis, unspecified: Secondary | ICD-10-CM | POA: Diagnosis not present
